# Patient Record
Sex: Female | Born: 1990 | Race: Black or African American | Hispanic: No | Marital: Single | Smoking: Never smoker
Health system: Southern US, Community
[De-identification: ages and names within clinical notes are randomized; demographics above are authoritative.]

## PROBLEM LIST (undated history)

## (undated) DIAGNOSIS — K219 Gastro-esophageal reflux disease without esophagitis: Secondary | ICD-10-CM

## (undated) DIAGNOSIS — J302 Other seasonal allergic rhinitis: Secondary | ICD-10-CM

## (undated) DIAGNOSIS — J45909 Unspecified asthma, uncomplicated: Secondary | ICD-10-CM

## (undated) HISTORY — PX: UPPER GI ENDOSCOPY: SHX6162

## (undated) HISTORY — PX: OTHER SURGICAL HISTORY: SHX169

## (undated) HISTORY — DX: Other seasonal allergic rhinitis: J30.2

---
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
## 2011-04-04 LAB — URINALYSIS, ROUTINE W REFLEX MICROSCOPIC
Glucose, UA: NEGATIVE mg/dL
Ketones, ur: NEGATIVE mg/dL
Nitrite: NEGATIVE
Protein, ur: NEGATIVE mg/dL

## 2011-04-04 LAB — RPR: RPR Ser Ql: NONREACTIVE

## 2011-04-04 LAB — WET PREP, GENITAL
Clue Cells Wet Prep HPF POC: NONE SEEN
WBC, Wet Prep HPF POC: NONE SEEN

## 2011-04-04 LAB — PREGNANCY, URINE: Preg Test, Ur: NEGATIVE

## 2012-06-17 ENCOUNTER — Emergency Department (HOSPITAL_BASED_OUTPATIENT_CLINIC_OR_DEPARTMENT_OTHER)
Admission: EM | Admit: 2012-06-17 | Discharge: 2012-06-17 | Disposition: A | Discharge status: Home / Self Care | Payer: Self-pay | Attending: Emergency Medicine | Admitting: Emergency Medicine

## 2012-06-17 ENCOUNTER — Encounter (HOSPITAL_BASED_OUTPATIENT_CLINIC_OR_DEPARTMENT_OTHER): Payer: Self-pay | Admitting: *Deleted

## 2012-06-17 DIAGNOSIS — F1010 Alcohol abuse, uncomplicated: Secondary | ICD-10-CM | POA: Insufficient documentation

## 2012-06-17 DIAGNOSIS — R109 Unspecified abdominal pain: Secondary | ICD-10-CM | POA: Insufficient documentation

## 2012-06-17 DIAGNOSIS — R110 Nausea: Secondary | ICD-10-CM | POA: Insufficient documentation

## 2012-06-17 LAB — URINALYSIS, ROUTINE W REFLEX MICROSCOPIC
Bilirubin Urine: NEGATIVE
Glucose, UA: NEGATIVE mg/dL
Ketones, ur: NEGATIVE mg/dL
Nitrite: NEGATIVE
pH: 7.5 (ref 5.0–8.0)

## 2012-06-17 NOTE — ED Notes (Signed)
She is cursing, loud and rude behavior at triage. Her friend states she drank 1/2 bottle of [place]gan before coming here and is drunk.

## 2012-06-17 NOTE — ED Notes (Signed)
Called pt from lobby to treatment with no answer.

## 2012-06-17 NOTE — ED Notes (Signed)
Nausea today. Abdominal pain.

## 2012-06-17 NOTE — ED Notes (Signed)
Called pt from lobby with no answer 

## 2012-10-04 ENCOUNTER — Encounter (HOSPITAL_COMMUNITY): Payer: Self-pay | Admitting: Emergency Medicine

## 2012-10-04 ENCOUNTER — Emergency Department (HOSPITAL_COMMUNITY)
Admission: EM | Admit: 2012-10-04 | Discharge: 2012-10-04 | Disposition: A | Discharge status: Home / Self Care | Payer: BC Managed Care – PPO | Attending: Emergency Medicine | Admitting: Emergency Medicine

## 2012-10-04 DIAGNOSIS — X58XXXA Exposure to other specified factors, initial encounter: Secondary | ICD-10-CM | POA: Insufficient documentation

## 2012-10-04 DIAGNOSIS — IMO0002 Reserved for concepts with insufficient information to code with codable children: Secondary | ICD-10-CM | POA: Insufficient documentation

## 2012-10-04 DIAGNOSIS — S30814A Abrasion of vagina and vulva, initial encounter: Secondary | ICD-10-CM

## 2012-10-04 MED ORDER — LIDOCAINE 4 % EX CREA: TOPICAL_CREAM | Freq: Every day | CUTANEOUS | Status: DC | PRN

## 2012-10-04 MED ORDER — LIDOCAINE HCL 2 % EX GEL: Freq: Every day | CUTANEOUS | Status: DC | PRN

## 2012-10-04 MED ORDER — LIDOCAINE HCL 2 % EX GEL
Freq: Every day | CUTANEOUS | Status: DC | PRN
  Filled 2012-10-04: qty 5

## 2012-10-04 NOTE — ED Provider Notes (Signed)
History     CSN: [id number]  Arrival date & time 10/04/12  1155   First MD Initiated Contact with Patient 10/04/12 1228      Chief Complaint  Patient presents with  . Laceration    (Consider location/radiation/quality/duration/timing/severity/associated sxs/prior treatment) HPI Comments: Patient is a 21 year old female presents emergency department chief complaint of post coital labial tear.  Incident occurred approximately 2 days ago and patient reports mild pain with urination and minimal bleeding.  Patient denies any abnormal vaginal discharge or malodor.  Patient has not had intercourse since the incident and is unaware of any dyspareunia.  She denies abdominal pain, lower back pain, urinary frequency or urgency, fevers, night sweats, or chills. Pt does not have an OBGYN. No  other complaints at this time.   The history is provided by the patient.    History reviewed. No pertinent past medical history.  History reviewed. No pertinent past surgical history.  History reviewed. No pertinent family history.  History  Substance Use Topics  . Smoking status: Never Smoker   . Smokeless tobacco: Not on file  . Alcohol Use: Yes    OB History    Grav Para Term Preterm Abortions TAB SAB Ect Mult Living                  Review of Systems  Constitutional: Negative for fever, diaphoresis and activity change.  HENT: Negative for congestion and neck pain.   Respiratory: Negative for cough.   Genitourinary: Positive for vaginal pain. Negative for dysuria, urgency, frequency, hematuria, flank pain, decreased urine volume, vaginal bleeding, vaginal discharge, enuresis, difficulty urinating, menstrual problem and pelvic pain.  Musculoskeletal: Negative for myalgias.  Skin: Negative for color change and wound.  Neurological: Negative for headaches.  All other systems reviewed and are negative.    Allergies  Review of patient's allergies indicates no known allergies.  Home  Medications  No current outpatient prescriptions on file.  BP 120/77  Pulse 63  Temp 98.4 F (36.9 C) (Oral)  Resp 18  LMP 09/25/2012  Physical Exam  Nursing note and vitals reviewed. Constitutional: She is oriented to person, place, and time. She appears well-developed and well-nourished. No distress.  HENT:  Head: Normocephalic and atraumatic.  Eyes: Conjunctivae normal and EOM are normal.  Neck: Normal range of motion.  Pulmonary/Chest: Effort normal.  Genitourinary:          Pelvic exam deferred.   Musculoskeletal: Normal range of motion.  Neurological: She is alert and oriented to person, place, and time.  Skin: Skin is warm and dry. No rash noted. She is not diaphoretic.  Psychiatric: She has a normal mood and affect. Her behavior is normal.    ED Course  Procedures (including critical care time)   Labs Reviewed  HERPES SIMPLEX VIRUS CULTURE   No results found.   No diagnosis found.    MDM  Pt presents with post coital abrasion. On exam is appears more like an abrasion rather then ulceration, however HSV cultures pending as well. Pt denies abnormal odor or dc & has no hx of STD. Pelvic exam deferred, reccommended OBGYN follow up and no intercourse until completely healed.         Jaci Carrel, [place] 10/04/12 1339

## 2012-10-04 NOTE — ED Notes (Addendum)
Reports cut to labia occuring after sexual intimacy. Bleeding noted on toilet tissue and burns. Not requiring panty liner, tampon or pad not experiencing vaginal bleeding. Pain 4/10 however when cut is exposed to urine pain 10/10.

## 2012-10-04 NOTE — ED Provider Notes (Signed)
Medical screening examination/treatment/procedure(s) were performed by non-physician practitioner and as supervising physician I was immediately available for consultation/collaboration.   Celene Kras, MD 10/04/12 1341

## 2012-10-06 LAB — HERPES SIMPLEX VIRUS CULTURE
Culture: NOT DETECTED
Special Requests: NORMAL

## 2013-02-02 ENCOUNTER — Emergency Department (HOSPITAL_COMMUNITY)
Admission: EM | Admit: 2013-02-02 | Discharge: 2013-02-02 | Disposition: A | Discharge status: Home / Self Care | Payer: BC Managed Care – PPO | Attending: Emergency Medicine | Admitting: Emergency Medicine

## 2013-02-02 ENCOUNTER — Encounter (HOSPITAL_COMMUNITY): Payer: Self-pay

## 2013-02-02 DIAGNOSIS — J45909 Unspecified asthma, uncomplicated: Secondary | ICD-10-CM | POA: Insufficient documentation

## 2013-02-02 DIAGNOSIS — R109 Unspecified abdominal pain: Secondary | ICD-10-CM | POA: Insufficient documentation

## 2013-02-02 DIAGNOSIS — Z3202 Encounter for pregnancy test, result negative: Secondary | ICD-10-CM | POA: Insufficient documentation

## 2013-02-02 HISTORY — DX: Unspecified asthma, uncomplicated: J45.909

## 2013-02-02 LAB — URINALYSIS, ROUTINE W REFLEX MICROSCOPIC
Leukocytes, UA: NEGATIVE
Nitrite: NEGATIVE
Protein, ur: NEGATIVE mg/dL
Urobilinogen, UA: 0.2 mg/dL (ref 0.0–1.0)

## 2013-02-02 LAB — POCT PREGNANCY, URINE: Preg Test, Ur: NEGATIVE

## 2013-02-02 NOTE — ED Provider Notes (Signed)
History     CSN: [id number]  Arrival date & time 02/02/13  1004   First MD Initiated Contact with Patient 02/02/13 1033      Chief Complaint  Patient presents with  . Headache  . Abdominal Pain    (Consider location/radiation/quality/duration/timing/severity/associated sxs/prior treatment) HPI... intermittent lower abdominal cramping for several days. Menstrual periods approximately one week late. Patient is sexually active with condoms for birth control. No dysuria, fever, chills, nausea, vomiting, vaginal bleeding or discharge. She [facility].  Nothing makes symptoms better or worse. Severity is mild. No radiation.  Past Medical History  Diagnosis Date  . Asthma     History reviewed. No pertinent past surgical history.  Family History  Problem Relation Age of Onset  . Hypertension Mother   . Hypertension Father     History  Substance Use Topics  . Smoking status: Never Smoker   . Smokeless tobacco: Never Used  . Alcohol Use: No    OB History   Grav Para Term Preterm Abortions TAB SAB Ect Mult Living                  Review of Systems  All other systems reviewed and are negative.    Allergies  Review of patient's allergies indicates no known allergies.  Home Medications  No current outpatient prescriptions on file.  BP 137/92  Pulse 81  Temp(Src) 97.8 F (36.6 C) (Oral)  Resp 22  SpO2 100%  LMP 12/26/2012  Physical Exam  Nursing note and vitals reviewed. Constitutional: She is oriented to person, place, and time. She appears well-developed and well-nourished.  HENT:  Head: Normocephalic and atraumatic.  Eyes: Conjunctivae and EOM are normal. Pupils are equal, round, and reactive to light.  Neck: Normal range of motion. Neck supple.  Cardiovascular: Normal rate, regular rhythm and normal heart sounds.   Pulmonary/Chest: Effort normal and breath sounds normal.  Abdominal: Soft. Bowel sounds are normal.  No  lower abdominal tenderness to palpation  Musculoskeletal: Normal range of motion.  Neurological: She is alert and oriented to person, place, and time.  Skin: Skin is warm and dry.  Psychiatric: She has a normal mood and affect.    ED Course  Procedures (including critical care time)  Labs Reviewed  URINALYSIS, ROUTINE W REFLEX MICROSCOPIC  POCT PREGNANCY, URINE   No results found.   1. Abdominal pain       MDM   Patient looks well. No acute abdomen. No unusual vaginal discharge or bleeding. She is smiling and pleasant.  Pelvic exam not indicated. [facility]. urinalysis and pregnancy test negative       Donnetta Hutching, MD 02/02/13 1239

## 2013-02-02 NOTE — ED Notes (Signed)
Pt. Is not able to use the restroom at this time but she is aware that we need urine.

## 2013-02-02 NOTE — ED Notes (Signed)
Patient c/o intermittent headaches and abdominal cramping. Patient states this is like symptoms of menstrual pain, but is a week later than usual. Patient states she did a home pregnancy test and it was negative. Patient also states she is having a cream-colored vaginal discharge.

## 2014-03-28 ENCOUNTER — Encounter (HOSPITAL_BASED_OUTPATIENT_CLINIC_OR_DEPARTMENT_OTHER): Payer: Self-pay | Admitting: Emergency Medicine

## 2014-03-28 ENCOUNTER — Emergency Department (HOSPITAL_BASED_OUTPATIENT_CLINIC_OR_DEPARTMENT_OTHER)
Admission: EM | Admit: 2014-03-28 | Discharge: 2014-03-28 | Disposition: A | Discharge status: Home / Self Care | Payer: BC Managed Care – PPO | Attending: Emergency Medicine | Admitting: Emergency Medicine

## 2014-03-28 DIAGNOSIS — J45909 Unspecified asthma, uncomplicated: Secondary | ICD-10-CM | POA: Insufficient documentation

## 2014-03-28 DIAGNOSIS — B3731 Acute candidiasis of vulva and vagina: Secondary | ICD-10-CM

## 2014-03-28 DIAGNOSIS — B373 Candidiasis of vulva and vagina: Secondary | ICD-10-CM

## 2014-03-28 DIAGNOSIS — N898 Other specified noninflammatory disorders of vagina: Secondary | ICD-10-CM | POA: Insufficient documentation

## 2014-03-28 DIAGNOSIS — Z3202 Encounter for pregnancy test, result negative: Secondary | ICD-10-CM | POA: Insufficient documentation

## 2014-03-28 LAB — URINALYSIS, ROUTINE W REFLEX MICROSCOPIC
Bilirubin Urine: NEGATIVE
GLUCOSE, UA: NEGATIVE mg/dL
HGB URINE DIPSTICK: NEGATIVE
Ketones, ur: NEGATIVE mg/dL
Nitrite: NEGATIVE
PH: 7.5 (ref 5.0–8.0)
PROTEIN: NEGATIVE mg/dL
SPECIFIC GRAVITY, URINE: 1.019 (ref 1.005–1.030)
Urobilinogen, UA: 1 mg/dL (ref 0.0–1.0)

## 2014-03-28 LAB — URINE MICROSCOPIC-ADD ON

## 2014-03-28 LAB — WET PREP, GENITAL: TRICH WET PREP: NONE SEEN

## 2014-03-28 LAB — PREGNANCY, URINE: PREG TEST UR: NEGATIVE

## 2014-03-28 MED ORDER — FLUCONAZOLE 100 MG PO TABS
200.0000 mg | ORAL_TABLET | Freq: Once | ORAL | Status: AC
  Administered 2014-03-28: 200 mg via ORAL
  Filled 2014-03-28: qty 2

## 2014-03-28 NOTE — Discharge Instructions (Signed)
Candidal Vulvovaginitis  Candidal vulvovaginitis is an infection of the vagina and vulva. The vulva is the skin around the opening of the vagina. This may cause itching and discomfort in and around the vagina.   HOME CARE  · Only take medicine as told by your doctor.  · Do not have sex (intercourse) until the infection is healed or as told by your doctor.  · Practice safe sex.  · Tell your sex partner about your infection.  · Do not douche or use tampons.  · Wear cotton underwear. Do not wear tight pants or panty hose.  · Eat yogurt. This may help treat and prevent yeast infections.  GET HELP RIGHT AWAY IF:   · You have a fever.  · Your problems get worse during treatment or do not get better in 3 days.  · You have discomfort, irritation, or itching in your vagina or vulva area.  · You have pain after sex.  · You start to get belly (abdominal) pain.  MAKE SURE YOU:  · Understand these instructions.  · Will watch your condition.  · Will get help right away if you are not doing well or get worse.  Document Released: 03/08/2009 Document Revised: 03/03/2012 Document Reviewed: 03/08/2009  Exit[employer]® Patient Information ©2014 [employer].

## 2014-03-28 NOTE — ED Provider Notes (Signed)
CSN: [id number]     Arrival date & time 03/28/14  1316 History   First MD Initiated Contact with Patient 03/28/14 1332     Chief Complaint  Patient presents with  . Vaginal Itching     (Consider location/radiation/quality/duration/timing/severity/associated sxs/prior Treatment) Patient is a 23 y.o. female presenting with vaginal itching. The history is provided by the patient.  Vaginal Itching This is a new problem. Episode onset: 4 days. The problem occurs constantly. The problem has been gradually worsening. Associated symptoms comments: Vaginal itching and thick white discharge.  No urinary sx and last menses finished 4 days ago. Nothing aggravates the symptoms. Nothing relieves the symptoms. She has tried nothing for the symptoms. The treatment provided no relief.    Past Medical History  Diagnosis Date  . Asthma    History reviewed. No pertinent past surgical history. Family History  Problem Relation Age of Onset  . Hypertension Mother   . Hypertension Father    History  Substance Use Topics  . Smoking status: Never Smoker   . Smokeless tobacco: Never Used  . Alcohol Use: No   OB History   Grav Para Term Preterm Abortions TAB SAB Ect Mult Living                 Review of Systems  All other systems reviewed and are negative.      Allergies  Review of patient's allergies indicates no known allergies.  Home Medications  No current outpatient prescriptions on file. BP 129/86  Pulse 65  Temp(Src) 98 F (36.7 C)  Resp 20  Ht 5\' 7"  (1.702 m)  Wt 150 lb (68.04 kg)  BMI 23.49 kg/m2  SpO2 100%  LMP 03/22/2014 Physical Exam  Nursing note and vitals reviewed. Constitutional: She is oriented to person, place, and time. She appears well-developed and well-nourished. No distress.  HENT:  Head: Normocephalic and atraumatic.  Cardiovascular: Normal rate.   Pulmonary/Chest: Effort normal.  Abdominal: Soft. She exhibits no distension. There is no tenderness. There is  no rebound and no guarding.  Genitourinary: Uterus normal. There is no tenderness on the right labia. There is no tenderness on the left labia. Cervix exhibits discharge. Cervix exhibits no motion tenderness and no friability. No erythema, tenderness or bleeding around the vagina. Vaginal discharge found.  Curd like white discharge  Neurological: She is alert and oriented to person, place, and time.  Skin: Skin is warm and dry. No rash noted. No erythema.  Psychiatric: She has a normal mood and affect. Her behavior is normal.    ED Course  Procedures (including critical care time) Labs Review Labs Reviewed  WET PREP, GENITAL - Abnormal; Notable for the following:    Yeast Wet Prep HPF POC MODERATE (*)    Clue Cells Wet Prep HPF POC TOO NUMEROUS TO COUNT (*)    WBC, Wet Prep HPF POC TOO NUMEROUS TO COUNT (*)    All other components within normal limits  GC/CHLAMYDIA PROBE AMP  PREGNANCY, URINE  URINALYSIS, ROUTINE W REFLEX MICROSCOPIC   Imaging Review No results found.   EKG Interpretation None      MDM   Final diagnoses:  Vaginal candida    Patient here with symptoms most consistent with a yeast infection and vaginal exam consistent with yeast with curd-like discharge but no pain or tenderness. Low risk for STD.  Wet prep with white blood cells too numerous to count and moderate yeast. Will treat with Diflucan and over-the-counter vaginal treatment.  UPT neg.    Gwyneth SproutWhitney Jupiter Boys, MD 03/28/14 [phone]

## 2014-03-28 NOTE — ED Notes (Signed)
Pelvic cart set up at pt bedside. 

## 2014-03-28 NOTE — ED Notes (Signed)
Reports onset of vaginal itching, dryness two days ago.  Has not used any products to help with this at home. Denies changes in tampons, lotions, or soaps.  Reports shaving recently.  In a monogamous female relationship, uses protection.

## 2014-03-29 LAB — GC/CHLAMYDIA PROBE AMP
CT Probe RNA: NEGATIVE
GC Probe RNA: NEGATIVE

## 2014-05-08 ENCOUNTER — Encounter (HOSPITAL_BASED_OUTPATIENT_CLINIC_OR_DEPARTMENT_OTHER): Payer: Self-pay | Admitting: Emergency Medicine

## 2014-05-08 ENCOUNTER — Emergency Department (HOSPITAL_BASED_OUTPATIENT_CLINIC_OR_DEPARTMENT_OTHER)
Admission: EM | Admit: 2014-05-08 | Discharge: 2014-05-09 | Disposition: A | Discharge status: Home / Self Care | Payer: BC Managed Care – PPO | Attending: Emergency Medicine | Admitting: Emergency Medicine

## 2014-05-08 DIAGNOSIS — J029 Acute pharyngitis, unspecified: Secondary | ICD-10-CM

## 2014-05-08 DIAGNOSIS — J45909 Unspecified asthma, uncomplicated: Secondary | ICD-10-CM | POA: Insufficient documentation

## 2014-05-08 LAB — RAPID STREP SCREEN (MED CTR MEBANE ONLY): STREPTOCOCCUS, GROUP A SCREEN (DIRECT): NEGATIVE

## 2014-05-08 NOTE — ED Provider Notes (Signed)
CSN: [id number]     Arrival date & time 05/08/14  2312 History  This chart was scribed for Joya Gaskinsonald W Cilicia Borden, MD by Beverly MilchJ Harrison Collins, ED Scribe. This patient was seen in room MH06/MH06 and the patient's care was started at 12:07 PM.    Chief Complaint  Patient presents with  . Sore Throat    Patient is a [age] y.o. female presenting with pharyngitis. The history is provided by the patient. No language interpreter was used.  Sore Throat This is a new problem. The current episode started more than 2 days ago. The problem occurs constantly. The problem has been rapidly worsening. Associated symptoms include headaches. Pertinent negatives include no chest pain, no abdominal pain and no shortness of breath. The symptoms are aggravated by swallowing and eating. Nothing relieves the symptoms. She has tried nothing for the symptoms.    HPI Comments: Anita Bush is a 23 y.o. female who presents to the Emergency Department complaining of soreness in her throat. She reports associated white particles in her throat and a mild head ache. She states these symptoms began 3 days ago. Pt unsure of a possible relation to allergies. She denies fever, abdominal pain, and CP.   Past Medical History  Diagnosis Date  . Asthma     History reviewed. No pertinent past surgical history. Family History  Problem Relation Age of Onset  . Hypertension Mother   . Hypertension Father     History  Substance Use Topics  . Smoking status: Never Smoker   . Smokeless tobacco: Never Used  . Alcohol Use: No    OB History   Grav Para Term Preterm Abortions TAB SAB Ect Mult Living                  Review of Systems  HENT: Positive for sore throat.   Respiratory: Negative for shortness of breath.   Cardiovascular: Negative for chest pain.  Gastrointestinal: Negative for abdominal pain.  Neurological: Positive for headaches.  All other systems reviewed and are negative.   Allergies  Review of patient's  allergies indicates no known allergies.  Home Medications   Prior to Admission medications   Not on File     BP 111/67  Pulse 60  SpO2 98%  LMP 04/23/2014  Physical Exam  Nursing note and vitals reviewed. CONSTITUTIONAL: Well developed/well nourished HEAD: Normocephalic/atraumatic EYES: EOMI/PERRL ENMT: Mucous membranes moist, uvula midline, tonsils enlarged, erythema and exudation present NECK: supple no meningeal signs SPINE:entire spine nontender CV: S1/S2 noted, no murmurs/rubs/gallops noted LUNGS: Lungs are clear to auscultation bilaterally, no apparent distress NEURO: Pt is awake/alert, moves all extremitiesx4 EXTREMITIES: pulses normal, full ROM SKIN: warm, color normal PSYCH: no abnormalities of mood noted   ED Course  Procedures    DIAGNOSTIC STUDIES: Oxygen Saturation is 100% on RA, normal by my interpretation.     COORDINATION OF CARE: 12:11 AM- Pt advised of plan for treatment and pt agrees.   Labs Review Labs Reviewed  RAPID STREP SCREEN  CULTURE, GROUP A STREP     MDM   Final diagnoses:  None    Nursing notes including past medical history and social history reviewed and considered in documentation Labs/vital reviewed and considered   I personally performed the services described in this documentation, which was scribed in my presence. The recorded information has been reviewed and is accurate.      Joya Gaskinsonald W Paxson Harrower, MD 05/09/14 [phone]

## 2014-05-08 NOTE — ED Notes (Signed)
Pt reports throat pain purulent drainage noted

## 2014-05-09 MED ORDER — PENICILLIN V POTASSIUM 500 MG PO TABS: 500.0000 mg | ORAL_TABLET | Freq: Four times a day (QID) | ORAL | Status: DC

## 2014-05-10 LAB — CULTURE, GROUP A STREP

## 2014-05-12 ENCOUNTER — Ambulatory Visit (INDEPENDENT_AMBULATORY_CARE_PROVIDER_SITE_OTHER): Payer: BC Managed Care – PPO | Admitting: Physician Assistant

## 2014-05-12 ENCOUNTER — Encounter: Payer: Self-pay | Admitting: Physician Assistant

## 2014-05-12 VITALS — BP 98/64 | HR 69 | Temp 98.5°F | Resp 16 | Ht 67.0 in | Wt 168.0 lb

## 2014-05-12 DIAGNOSIS — Z7189 Other specified counseling: Secondary | ICD-10-CM

## 2014-05-12 DIAGNOSIS — J358 Other chronic diseases of tonsils and adenoids: Secondary | ICD-10-CM | POA: Insufficient documentation

## 2014-05-12 DIAGNOSIS — Z7689 Persons encountering health services in other specified circumstances: Secondary | ICD-10-CM

## 2014-05-12 NOTE — Patient Instructions (Addendum)
STOP Penicillin.  The "white spots" on your tonsils seem consistent with Tonsilliths (tonsil stones) that are an accumulation of food particles that are getting stuck in the pits on your tonsils. Please read handout on this.  I am setting you up with ENT due to your concerns about your symptoms.

## 2014-05-12 NOTE — Assessment & Plan Note (Signed)
Multiple.  Patient also with cryptic tonsils.  Workup for strep pharyngitis revealed negative rapid strep and culture.  There is no need for patient to continue antibiotic.  Patient educated on ways to remove and prevent tonsil stones.  Referral to ENT placed giving number of stones and patient's concerns about stones.

## 2014-05-12 NOTE — Progress Notes (Signed)
Patient presents to clinic today to establish care.  Acute Concerns: Patient seen in ER on 05/08/14 and diagnosed with pharyngitis.  Rapid strep and Strep culture were negative but patient was given Rx for penicillin due to "spots" on tonsils.  Patient has been taking Penicillin as directed, but has been having considerable nausea with antibiotic.  Patient denies sore throat, dysphagia, SOB, cough.  Endorses still notices white spots on her tonsils.  Denies recent travel or sick contact. Has history of seasonal allergies. Is wondering if she still needs antibiotic.  Chronic Issues: Denies significant PMH  Health Maintenance: Dental -- UTD [employer] -- Overdue. Immunizations -- Has been within 10 years -- likely 5-6 years ago. PAP -- Cannot remember.    Past Medical History  Diagnosis Date  . Asthma   . Seasonal allergies     Past Surgical History  Procedure Laterality Date  . Unremarkable.      No current outpatient prescriptions on file prior to visit.   No current facility-administered medications on file prior to visit.    No Known Allergies  Family History  Problem Relation Age of Onset  . Hypertension Mother     Living  . Hypertension Father     Living  . Arthritis Mother   . Hyperlipidemia Mother   . Asthma Mother   . Hypertension Maternal Grandmother   . Arthritis Maternal Grandmother   . Hyperlipidemia Maternal Grandmother   . Diabetes Maternal Grandmother     Pre-diabetes  . Allergy (severe) Brother   . Allergy (severe) Sister     History   Social History  . Marital Status: Single    Spouse Name: N/A    Number of Children: N/A  . Years of Education: N/A   Occupational History  . Not on file.   Social History Main Topics  . Smoking status: Never Smoker   . Smokeless tobacco: Never Used  . Alcohol Use: Yes     Comment: social -- weekends  . Drug Use: No  . Sexual Activity: Yes    Birth Control/ Protection: Condom   Other Topics Concern  . Not  on file   Social History Narrative  . No narrative on file   Review of Systems  Constitutional: Negative for fever and weight loss.  HENT: Negative for ear discharge, ear pain, hearing loss and tinnitus.   Eyes: Negative for blurred vision, double vision, photophobia and pain.  Respiratory: Negative for cough and shortness of breath.   Cardiovascular: Negative for chest pain and palpitations.  Gastrointestinal: Negative for heartburn, nausea, vomiting, abdominal pain, diarrhea, constipation, blood in stool and melena.  Genitourinary: Negative for dysuria, urgency, frequency, hematuria and flank pain.  Skin: Negative for rash.  Neurological: Negative for dizziness, loss of consciousness and headaches.  Endo/Heme/Allergies: Positive for environmental allergies.  Psychiatric/Behavioral: Negative for depression, suicidal ideas, hallucinations and substance abuse. The patient is not nervous/anxious and does not have insomnia.     BP 98/64  Pulse 69  Temp(Src) 98.5 F (36.9 C) (Oral)  Resp 16  Ht 5\' 7"  (1.702 m)  Wt 168 lb (76.204 kg)  BMI 26.31 kg/m2  SpO2 98%  LMP 04/23/2014  Physical Exam  Vitals reviewed. Constitutional: She is oriented to person, place, and time and well-developed, well-nourished, and in no distress.  HENT:  Head: Normocephalic and atraumatic.  Right Ear: Tympanic membrane, external ear and ear canal normal.  Left Ear: Tympanic membrane, external ear and ear canal normal.  Nose: Nose normal.  Mouth/Throat: Mucous membranes are normal. No oropharyngeal exudate, posterior oropharyngeal edema, posterior oropharyngeal erythema or tonsillar abscesses.  Tonsils are 1+ bilaterally and cryptic with presence of multiple tonsilliths residing in crypts.  Tonsils are not edematous or erythematous.  There is no appreciable lymphadenopathy.  Eyes: Conjunctivae are normal. Pupils are equal, round, and reactive to light.  Neck: Neck supple.  Cardiovascular: Normal rate,  regular rhythm, normal heart sounds and intact distal pulses.   Pulmonary/Chest: Effort normal and breath sounds normal. No respiratory distress. She has no wheezes. She has no rales. She exhibits no tenderness.  Neurological: She is alert and oriented to person, place, and time.  Skin: Skin is warm and dry. No rash noted.  Psychiatric: Affect normal.    Recent Results (from the past 2160 hour(s))  URINALYSIS, ROUTINE W REFLEX MICROSCOPIC     Status: Abnormal   Collection Time    03/28/14  1:45 PM      Result Value Ref Range   Color, Urine YELLOW  YELLOW   APPearance CLOUDY (*) CLEAR   Specific Gravity, Urine 1.019  1.005 - 1.030   pH 7.5  5.0 - 8.0   Glucose, UA NEGATIVE  NEGATIVE mg/dL   Hgb urine dipstick NEGATIVE  NEGATIVE   Bilirubin Urine NEGATIVE  NEGATIVE   Ketones, ur NEGATIVE  NEGATIVE mg/dL   Protein, ur NEGATIVE  NEGATIVE mg/dL   Urobilinogen, UA 1.0  0.0 - 1.0 mg/dL   Nitrite NEGATIVE  NEGATIVE   Leukocytes, UA MODERATE (*) NEGATIVE  PREGNANCY, URINE     Status: None   Collection Time    03/28/14  1:45 PM      Result Value Ref Range   Preg Test, Ur NEGATIVE  NEGATIVE   Comment:            THE SENSITIVITY OF THIS     METHODOLOGY IS >20 mIU/mL.  URINE MICROSCOPIC-ADD ON     Status: Abnormal   Collection Time    03/28/14  1:45 PM      Result Value Ref Range   Squamous Epithelial / LPF MANY (*) RARE   WBC, UA 7-10  <3 WBC/hpf   Bacteria, UA MANY (*) RARE   Urine-Other MUCOUS PRESENT     Comment: RARE YEAST  WET PREP, GENITAL     Status: Abnormal   Collection Time    03/28/14  1:46 PM      Result Value Ref Range   Yeast Wet Prep HPF POC MODERATE (*) NONE SEEN   Trich, Wet Prep NONE SEEN  NONE SEEN   Clue Cells Wet Prep HPF POC TOO NUMEROUS TO COUNT (*) NONE SEEN   WBC, Wet Prep HPF POC TOO NUMEROUS TO COUNT (*) NONE SEEN  GC/CHLAMYDIA PROBE AMP     Status: None   Collection Time    03/28/14  1:46 PM      Result Value Ref Range   CT Probe RNA NEGATIVE   NEGATIVE   GC Probe RNA NEGATIVE  NEGATIVE   Comment: (NOTE)                                                                                               **  Normal Reference Range: Negative**          Assay performed using the Gen-Probe APTIMA COMBO2 (R) Assay.     Acceptable specimen types for this assay include APTIMA Swabs (Unisex,     endocervical, urethral, or vaginal), first void urine, and ThinPrep     liquid based cytology samples.     Performed at [facility]  RAPID STREP SCREEN     Status: None   Collection Time    05/08/14 11:47 PM      Result Value Ref Range   Streptococcus, Group A Screen (Direct) NEGATIVE  NEGATIVE   Comment: (NOTE)     A Rapid Antigen test may result negative if the antigen level in the     sample is below the detection level of this test. The FDA has not     cleared this test as a stand-alone test therefore the rapid antigen     negative result has reflexed to a Group A Strep culture.  CULTURE, GROUP A STREP     Status: None   Collection Time    05/08/14 11:47 PM      Result Value Ref Range   Specimen Description THROAT     Special Requests NONE     Culture       Value: STREPTOCOCCUS,BETA HEMOLYTIC NOT GROUP A     Performed at [facility]   Report Status 05/10/2014 FINAL      Assessment/Plan: Tonsillith Multiple.  Patient also with cryptic tonsils.  Workup for strep pharyngitis revealed negative rapid strep and culture.  There is no need for patient to continue antibiotic.  Patient educated on ways to remove and prevent tonsil stones.  Referral to ENT placed giving number of stones and patient's concerns about stones.  Encounter to establish care History reviewed and updated.  Last Tetanus 5-6 years ago.  Patient due for PAP smear.  Patient is to return to clinic for CPE with PAP and fasting labs.

## 2014-05-12 NOTE — Assessment & Plan Note (Signed)
History reviewed and updated.  Last Tetanus 5-6 years ago.  Patient due for PAP smear.  Patient is to return to clinic for CPE with PAP and fasting labs.

## 2014-05-12 NOTE — Progress Notes (Signed)
Pre visit review using our clinic review tool, if applicable. No additional management support is needed unless otherwise documented below in the visit note/SLS  

## 2014-12-19 ENCOUNTER — Emergency Department (HOSPITAL_COMMUNITY)
Admission: EM | Admit: 2014-12-19 | Discharge: 2014-12-19 | Disposition: A | Discharge status: Home / Self Care | Payer: BC Managed Care – PPO | Attending: Emergency Medicine | Admitting: Emergency Medicine

## 2014-12-19 ENCOUNTER — Emergency Department (HOSPITAL_COMMUNITY): Payer: BC Managed Care – PPO

## 2014-12-19 ENCOUNTER — Encounter (HOSPITAL_COMMUNITY): Payer: Self-pay | Admitting: Emergency Medicine

## 2014-12-19 DIAGNOSIS — Y92410 Unspecified street and highway as the place of occurrence of the external cause: Secondary | ICD-10-CM | POA: Insufficient documentation

## 2014-12-19 DIAGNOSIS — Y998 Other external cause status: Secondary | ICD-10-CM | POA: Insufficient documentation

## 2014-12-19 DIAGNOSIS — J45909 Unspecified asthma, uncomplicated: Secondary | ICD-10-CM | POA: Diagnosis not present

## 2014-12-19 DIAGNOSIS — S0990XA Unspecified injury of head, initial encounter: Secondary | ICD-10-CM | POA: Diagnosis present

## 2014-12-19 DIAGNOSIS — Y9389 Activity, other specified: Secondary | ICD-10-CM | POA: Diagnosis not present

## 2014-12-19 MED ORDER — ACETAMINOPHEN 325 MG PO TABS
650.0000 mg | ORAL_TABLET | Freq: Once | ORAL | Status: AC
  Administered 2014-12-19: 650 mg via ORAL
  Filled 2014-12-19: qty 2
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
## 2014-12-28 ENCOUNTER — Emergency Department (HOSPITAL_BASED_OUTPATIENT_CLINIC_OR_DEPARTMENT_OTHER)
Admission: EM | Admit: 2014-12-28 | Discharge: 2014-12-28 | Disposition: A | Discharge status: Home / Self Care | Payer: BLUE CROSS/BLUE SHIELD | Attending: Emergency Medicine | Admitting: Emergency Medicine

## 2014-12-28 ENCOUNTER — Encounter (HOSPITAL_BASED_OUTPATIENT_CLINIC_OR_DEPARTMENT_OTHER): Payer: Self-pay | Admitting: Emergency Medicine

## 2014-12-28 DIAGNOSIS — N921 Excessive and frequent menstruation with irregular cycle: Secondary | ICD-10-CM | POA: Diagnosis not present

## 2014-12-28 DIAGNOSIS — R42 Dizziness and giddiness: Secondary | ICD-10-CM | POA: Insufficient documentation

## 2014-12-28 DIAGNOSIS — Z3202 Encounter for pregnancy test, result negative: Secondary | ICD-10-CM | POA: Insufficient documentation

## 2014-12-28 DIAGNOSIS — J45909 Unspecified asthma, uncomplicated: Secondary | ICD-10-CM | POA: Insufficient documentation

## 2014-12-28 DIAGNOSIS — N926 Irregular menstruation, unspecified: Secondary | ICD-10-CM

## 2014-12-28 DIAGNOSIS — N939 Abnormal uterine and vaginal bleeding, unspecified: Secondary | ICD-10-CM | POA: Diagnosis present

## 2014-12-28 LAB — BASIC METABOLIC PANEL
Anion gap: 8 (ref 5–15)
BUN: 13 mg/dL (ref 6–23)
CALCIUM: 9.5 mg/dL (ref 8.4–10.5)
CHLORIDE: 106 meq/L (ref 96–112)
CO2: 27 mmol/L (ref 19–32)
Creatinine, Ser: 0.96 mg/dL (ref 0.50–1.10)
GFR calc Af Amer: >90 mL/min (ref >90)
GFR calc non Af Amer: 83 mL/min — ABNORMAL LOW (ref >90)
GLUCOSE: 90 mg/dL (ref 70–99)
POTASSIUM: 3.6 mmol/L (ref 3.5–5.1)
SODIUM: 141 mmol/L (ref 135–145)

## 2014-12-28 LAB — CBC
HCT: 38.5 % (ref 36.0–46.0)
HEMOGLOBIN: 12.6 g/dL (ref 12.0–15.0)
MCH: 31 pg (ref 26.0–34.0)
MCHC: 32.7 g/dL (ref 30.0–36.0)
MCV: 94.6 fL (ref 78.0–100.0)
PLATELETS: 308 10*3/uL (ref 150–400)
RBC: 4.07 MIL/uL (ref 3.87–5.11)
RDW: 12.2 % (ref 11.5–15.5)
WBC: 6.2 10*3/uL (ref 4.0–10.5)

## 2014-12-28 LAB — WET PREP, GENITAL
Trich, Wet Prep: NONE SEEN
Yeast Wet Prep HPF POC: NONE SEEN

## 2014-12-28 LAB — PREGNANCY, URINE: Preg Test, Ur: NEGATIVE

## 2014-12-28 NOTE — ED Provider Notes (Signed)
CSN: [id number]     Arrival date & time 12/28/14  2013 History   First MD Initiated Contact with Patient 12/28/14 2035     Chief Complaint  Patient presents with  . Vaginal Bleeding     (Consider location/radiation/quality/duration/timing/severity/associated sxs/prior Treatment) HPI Comments: The patient is a 24 year old female presenting to the emergency department the chief complaint of abnormal vaginal bleeding. Patient reports onset of spotting 10 days ago. She reports normal menses but several days of spotting from 2 days ago until today. She reports brown vaginal spotting with increase in bleeding today. Patient reports associated cramping. Denies previous history of birth control use, IUD, implant. Reports using one tampon today for bleeding. Reports one episode of lightheadedness this week, no syncope.  The history is provided by the patient. No language interpreter was used.    Past Medical History  Diagnosis Date  . Asthma   . Seasonal allergies    Past Surgical History  Procedure Laterality Date  . Unremarkable.     Family History  Problem Relation Age of Onset  . Hypertension Mother     Living  . Hypertension Father     Living  . Arthritis Mother   . Hyperlipidemia Mother   . Asthma Mother   . Hypertension Maternal Grandmother   . Arthritis Maternal Grandmother   . Hyperlipidemia Maternal Grandmother   . Diabetes Maternal Grandmother     Pre-diabetes  . Allergy (severe) Brother   . Allergy (severe) Sister    History  Substance Use Topics  . Smoking status: Never Smoker   . Smokeless tobacco: Never Used  . Alcohol Use: Yes     Comment: social -- weekends   OB History    No data available     Review of Systems  Constitutional: Negative for fever and chills.  Gastrointestinal: Negative for nausea, vomiting and diarrhea.  Genitourinary: Positive for vaginal bleeding and pelvic pain. Negative for dysuria and vaginal discharge.  Neurological: Positive for  light-headedness. Negative for syncope.      Allergies  Review of patient's allergies indicates no known allergies.  Home Medications   Prior to Admission medications   Not on File   LMP 11/23/2014 (Exact Date) Physical Exam  Constitutional: She is oriented to person, place, and time. She appears well-developed and well-nourished. No distress.  HENT:  Head: Normocephalic and atraumatic.  Neck: Neck supple.  Pulmonary/Chest: Effort normal. No respiratory distress.  Abdominal: Soft. There is no tenderness. There is no rebound and no guarding.  Genitourinary: Cervix exhibits motion tenderness. Cervix exhibits no friability. Right adnexum displays no tenderness. Left adnexum displays no tenderness.  Minimal amount of dark blood in posterior vaginal vault. Mild CMT tenderness. Chaperone present.  Neurological: She is alert and oriented to person, place, and time.  Skin: Skin is warm and dry. She is not diaphoretic. No pallor.  Psychiatric: She has a normal mood and affect. Her behavior is normal.  Nursing note and vitals reviewed.   ED Course  Procedures (including critical care time) Labs Review Labs Reviewed  WET PREP, GENITAL - Abnormal; Notable for the following:    Clue Cells Wet Prep HPF POC FEW (*)    WBC, Wet Prep HPF POC FEW (*)    All other components within normal limits  BASIC METABOLIC PANEL - Abnormal; Notable for the following:    GFR calc non Af Amer 83 (*)    All other components within normal limits  GC/CHLAMYDIA PROBE AMP  CBC  PREGNANCY, URINE    Imaging Review No results found.   EKG Interpretation None      MDM   Final diagnoses:  Abnormal menses   Patient with abnormal menses, in no acute distress. Pelvic with minimal amount of blood. Labs reassuring and no signs of anemia, no UTI negative pregnancy. Patient has clue cells but is not complaining of vaginal discharge, will not treat at this time. Reevaluation discussed lab results and  treatment plan with the patient's mother and patient. Advised follow-up with women's clinic.Reports understanding and no other concerns at this time.  Patient is stable for discharge at this time.    Mellody DrownLauren Prosperity Darrough, PA-C 12/28/14 2202  Mirian MoMatthew Gentry, MD 12/31/14 [phone]

## 2014-12-28 NOTE — ED Notes (Signed)
Patient states that she goes through about 1 -2 pads a day since the 27th. She was spotting on the 27th. The patient reports since then it was a normal period. Cramping and lower back pain. Patient states that at times she feels dizziness.

## 2014-12-28 NOTE — Discharge Instructions (Signed)
Call for a follow up appointment with an OB/GYN for further evaluation of your abnormal period and possible treatment. Return if Symptoms worsen.   Take medication as prescribed.

## 2014-12-30 LAB — GC/CHLAMYDIA PROBE AMP
CT Probe RNA: NEGATIVE
GC PROBE AMP APTIMA: NEGATIVE
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
## 2015-09-01 DIAGNOSIS — M20022 Boutonniere deformity of left finger(s): Secondary | ICD-10-CM | POA: Insufficient documentation

## 2015-12-01 DIAGNOSIS — L810 Postinflammatory hyperpigmentation: Secondary | ICD-10-CM | POA: Insufficient documentation

## 2015-12-19 ENCOUNTER — Encounter (HOSPITAL_COMMUNITY): Payer: Self-pay

## 2015-12-19 ENCOUNTER — Other Ambulatory Visit (HOSPITAL_COMMUNITY)
Admission: RE | Admit: 2015-12-19 | Discharge: 2015-12-19 | Disposition: A | Discharge status: Home / Self Care | Payer: BLUE CROSS/BLUE SHIELD | Source: Ambulatory Visit | Attending: Emergency Medicine | Admitting: Emergency Medicine

## 2015-12-19 ENCOUNTER — Emergency Department (INDEPENDENT_AMBULATORY_CARE_PROVIDER_SITE_OTHER)
Admission: EM | Admit: 2015-12-19 | Discharge: 2015-12-19 | Disposition: A | Discharge status: Home / Self Care | Payer: BLUE CROSS/BLUE SHIELD | Source: Home / Self Care | Attending: Emergency Medicine | Admitting: Emergency Medicine

## 2015-12-19 DIAGNOSIS — Z113 Encounter for screening for infections with a predominantly sexual mode of transmission: Secondary | ICD-10-CM | POA: Diagnosis not present

## 2015-12-19 DIAGNOSIS — N898 Other specified noninflammatory disorders of vagina: Secondary | ICD-10-CM

## 2015-12-19 DIAGNOSIS — N760 Acute vaginitis: Secondary | ICD-10-CM | POA: Insufficient documentation

## 2015-12-19 DIAGNOSIS — A499 Bacterial infection, unspecified: Secondary | ICD-10-CM | POA: Diagnosis not present

## 2015-12-19 DIAGNOSIS — B9689 Other specified bacterial agents as the cause of diseases classified elsewhere: Secondary | ICD-10-CM

## 2015-12-19 LAB — POCT URINALYSIS DIP (DEVICE)
Bilirubin Urine: NEGATIVE
GLUCOSE, UA: NEGATIVE mg/dL
Hgb urine dipstick: NEGATIVE
Ketones, ur: NEGATIVE mg/dL
Leukocytes, UA: NEGATIVE
Nitrite: NEGATIVE
Protein, ur: NEGATIVE mg/dL
Specific Gravity, Urine: 1.025 (ref 1.005–1.030)
Urobilinogen, UA: 0.2 mg/dL (ref 0.0–1.0)
pH: 6.5 (ref 5.0–8.0)

## 2015-12-19 LAB — POCT PREGNANCY, URINE: PREG TEST UR: NEGATIVE

## 2015-12-19 MED ORDER — METRONIDAZOLE 0.75 % VA GEL: 1.0000 | Freq: Two times a day (BID) | VAGINAL | Status: DC

## 2015-12-19 NOTE — Discharge Instructions (Signed)

## 2015-12-19 NOTE — ED Notes (Signed)
C/o frequent BV

## 2015-12-19 NOTE — ED Provider Notes (Signed)
CSN: [id number]     Arrival date & time 12/19/15  1305 History   First MD Initiated Contact with Patient 12/19/15 1432     No chief complaint on file.  (Consider location/radiation/quality/duration/timing/severity/associated sxs/prior Treatment) HPI History obtained from patient:   LOCATION:vagina SEVERITY: DURATION:several days CONTEXT: sudden onset QUALITY:similar to previous BV infections MODIFYING FACTORS: None ASSOCIATED SYMPTOMS:cramps TIMING:constant    Past Medical History  Diagnosis Date  . Asthma   . Seasonal allergies    Past Surgical History  Procedure Laterality Date  . Unremarkable.     Family History  Problem Relation Age of Onset  . Hypertension Mother     Living  . Hypertension Father     Living  . Arthritis Mother   . Hyperlipidemia Mother   . Asthma Mother   . Hypertension Maternal Grandmother   . Arthritis Maternal Grandmother   . Hyperlipidemia Maternal Grandmother   . Diabetes Maternal Grandmother     Pre-diabetes  . Allergy (severe) Brother   . Allergy (severe) Sister    Social History  Substance Use Topics  . Smoking status: Never Smoker   . Smokeless tobacco: Never Used  . Alcohol Use: Yes     Comment: social -- weekends   OB History    No data available     Review of Systems ROS +'ve vaginal discharge  Denies: HEADACHE, NAUSEA, ABDOMINAL PAIN, CHEST PAIN, CONGESTION, DYSURIA, SHORTNESS OF BREATH  Allergies  Review of patient's allergies indicates no known allergies.  Home Medications   Prior to Admission medications   Not on File   Meds Ordered and Administered this Visit  Medications - No data to display  BP 123/77 mmHg  Pulse 60  Temp(Src) 98.5 F (36.9 C) (Oral)  Resp 16  SpO2 100% No data found.   Physical Exam  Constitutional: She is oriented to person, place, and time. She appears well-developed and well-nourished.  Pulmonary/Chest: Effort normal.  Genitourinary: Vaginal discharge found.  Female  chaperone was present during the examination. Examination is completed with patient's permission.  Speculum is placed in the vaginal vault large amount of take white vaginal discharge noted. Cervix is closed not friable and nontender.  No adnexal tenderness noted to palpation.  Neurological: She is alert and oriented to person, place, and time.  Skin: Skin is warm and dry.  Psychiatric: She has a normal mood and affect. Her behavior is normal. Judgment and thought content normal.    ED Course  Procedures (including critical care time)  Labs Review Labs Reviewed - No data to display  Imaging Review No results found.   Visual Acuity Review  Right Eye Distance:   Left Eye Distance:   Bilateral Distance:    Right Eye Near:   Left Eye Near:    Bilateral Near:         MDM   1. Vaginal discharge   2. BV (bacterial vaginosis)    Patient is advised to continue home symptomatic treatment. Prescription for metronidazole gel provided to the patient. Patient is advised that if there are new or worsening symptoms or attend the emergency department, or contact primary care provider. Instructions of care provided discharged home in stable condition.  THIS NOTE WAS GENERATED USING A VOICE RECOGNITION SOFTWARE PROGRAM. ALL REASONABLE EFFORTS  WERE MADE TO PROOFREAD THIS DOCUMENT FOR ACCURACY.     Tharon AquasFrank C Patrick, PA 12/19/15 [phone]

## 2015-12-20 LAB — CERVICOVAGINAL ANCILLARY ONLY: WET PREP (BD AFFIRM): NEGATIVE

## 2015-12-23 ENCOUNTER — Emergency Department (HOSPITAL_BASED_OUTPATIENT_CLINIC_OR_DEPARTMENT_OTHER)
Admission: EM | Admit: 2015-12-23 | Discharge: 2015-12-24 | Disposition: A | Discharge status: Home / Self Care | Payer: BLUE CROSS/BLUE SHIELD | Attending: Emergency Medicine | Admitting: Emergency Medicine

## 2015-12-23 ENCOUNTER — Encounter (HOSPITAL_BASED_OUTPATIENT_CLINIC_OR_DEPARTMENT_OTHER): Payer: Self-pay | Admitting: *Deleted

## 2015-12-23 DIAGNOSIS — Z202 Contact with and (suspected) exposure to infections with a predominantly sexual mode of transmission: Secondary | ICD-10-CM

## 2015-12-23 DIAGNOSIS — Z3202 Encounter for pregnancy test, result negative: Secondary | ICD-10-CM | POA: Diagnosis not present

## 2015-12-23 DIAGNOSIS — N898 Other specified noninflammatory disorders of vagina: Secondary | ICD-10-CM | POA: Diagnosis present

## 2015-12-23 DIAGNOSIS — Z792 Long term (current) use of antibiotics: Secondary | ICD-10-CM | POA: Insufficient documentation

## 2015-12-23 DIAGNOSIS — J45909 Unspecified asthma, uncomplicated: Secondary | ICD-10-CM | POA: Insufficient documentation

## 2015-12-23 LAB — URINALYSIS, ROUTINE W REFLEX MICROSCOPIC
Bilirubin Urine: NEGATIVE
Glucose, UA: NEGATIVE mg/dL
Hgb urine dipstick: NEGATIVE
Ketones, ur: 15 mg/dL — AB
LEUKOCYTES UA: NEGATIVE
Nitrite: NEGATIVE
PH: 6 (ref 5.0–8.0)
Protein, ur: NEGATIVE mg/dL
Specific Gravity, Urine: 1.019 (ref 1.005–1.030)

## 2015-12-23 LAB — PREGNANCY, URINE: Preg Test, Ur: NEGATIVE

## 2015-12-23 MED ORDER — LIDOCAINE HCL (PF) 1 % IJ SOLN
INTRAMUSCULAR | Status: AC
  Administered 2015-12-23: 5 mL
  Filled 2015-12-23: qty 5

## 2015-12-23 MED ORDER — AZITHROMYCIN 250 MG PO TABS
1000.0000 mg | ORAL_TABLET | Freq: Once | ORAL | Status: AC
  Administered 2015-12-23: 1000 mg via ORAL
  Filled 2015-12-23: qty 4

## 2015-12-23 MED ORDER — CEFTRIAXONE SODIUM 250 MG IJ SOLR
250.0000 mg | Freq: Once | INTRAMUSCULAR | Status: AC
  Administered 2015-12-23: 250 mg via INTRAMUSCULAR
  Filled 2015-12-23: qty 250

## 2015-12-23 NOTE — ED Notes (Signed)
States she is being treated for BV. Her sexual partner told her he has Herpes and now she is having vaginal burning. She is unsure if it is in her head since he told her.

## 2015-12-23 NOTE — Discharge Instructions (Signed)
Sexually Transmitted Disease °A sexually transmitted disease (STD) is a disease or infection that may be passed (transmitted) from person to person, usually during sexual activity. This may happen by way of saliva, semen, blood, vaginal mucus, or urine. Common STDs include: °· Gonorrhea. °· Chlamydia. °· Syphilis. °· HIV and AIDS. °· Genital herpes. °· Hepatitis B and C. °· Trichomonas. °· Human papillomavirus (HPV). °· Pubic lice. °· Scabies. °· Mites. °· Bacterial vaginosis. °WHAT ARE CAUSES OF STDs? °An STD may be caused by bacteria, a virus, or parasites. STDs are often transmitted during sexual activity if one person is infected. However, they may also be transmitted through nonsexual means. STDs may be transmitted after:  °· Sexual intercourse with an infected person. °· Sharing sex toys with an infected person. °· Sharing needles with an infected person or using unclean piercing or tattoo needles. °· Having intimate contact with the genitals, mouth, or rectal areas of an infected person. °· Exposure to infected fluids during birth. °WHAT ARE THE SIGNS AND SYMPTOMS OF STDs? °Different STDs have different symptoms. Some people may not have any symptoms. If symptoms are present, they may include: °· Painful or bloody urination. °· Pain in the pelvis, abdomen, vagina, anus, throat, or eyes. °· A skin rash, itching, or irritation. °· Growths, ulcerations, blisters, or sores in the genital and anal areas. °· Abnormal vaginal discharge with or without bad odor. °· Penile discharge in men. °· Fever. °· Pain or bleeding during sexual intercourse. °· Swollen glands in the groin area. °· Yellow skin and eyes (jaundice). This is seen with hepatitis. °· Swollen testicles. °· Infertility. °· Sores and blisters in the mouth. °HOW ARE STDs DIAGNOSED? °To make a diagnosis, your health care provider may: °· Take a medical history. °· Perform a physical exam. °· Take a sample of any discharge to examine. °· Swab the throat,  cervix, opening to the penis, rectum, or vagina for testing. °· Test a sample of your first morning urine. °· Perform blood tests. °· Perform a Pap test, if this applies. °· Perform a colposcopy. °· Perform a laparoscopy. °HOW ARE STDs TREATED? °Treatment depends on the STD. Some STDs may be treated but not cured. °· Chlamydia, gonorrhea, trichomonas, and syphilis can be cured with antibiotic medicine. °· Genital herpes, hepatitis, and HIV can be treated, but not cured, with prescribed medicines. The medicines lessen symptoms. °· Genital warts from HPV can be treated with medicine or by freezing, burning (electrocautery), or surgery. Warts may come back. °· HPV cannot be cured with medicine or surgery. However, abnormal areas may be removed from the cervix, vagina, or vulva. °· If your diagnosis is confirmed, your recent sexual partners need treatment. This is true even if they are symptom-free or have a negative culture or evaluation. They should not have sex until their health care providers say it is okay. °· Your health care provider may test you for infection again 3 months after treatment. °HOW CAN I REDUCE MY RISK OF GETTING AN STD? °Take these steps to reduce your risk of getting an STD: °· Use latex condoms, dental dams, and water-soluble lubricants during sexual activity. Do not use petroleum jelly or oils. °· Avoid having multiple sex partners. °· Do not have sex with someone who has other sex partners °· Do not have sex with anyone you do not know or who is at high risk for an STD. °· Avoid risky sex practices that can break your skin. °· Do not have sex   if you have open sores on your mouth or skin.  Avoid drinking too much alcohol or taking illegal drugs. Alcohol and drugs can affect your judgment and put you in a vulnerable position.  Avoid engaging in oral and anal sex acts.  Get vaccinated for HPV and hepatitis. If you have not received these vaccines in the past, talk to your health care  provider about whether one or both might be right for you.  If you are at risk of being infected with HIV, it is recommended that you take a prescription medicine daily to prevent HIV infection. This is called pre-exposure prophylaxis (PrEP). You are considered at risk if:  You are a man who has sex with other men (MSM).  You are a heterosexual man or woman and are sexually active with more than one partner.  You take drugs by injection.  You are sexually active with a partner who has HIV.  Talk with your health care provider about whether you are at high risk of being infected with HIV. If you choose to begin PrEP, you should first be tested for HIV. You should then be tested every 3 months for as long as you are taking PrEP. WHAT SHOULD I DO IF I THINK I HAVE AN STD?  See your health care provider.  Tell your sexual partner(s). They should be tested and treated for any STDs.  Do not have sex until your health care provider says it is okay. WHEN SHOULD I GET IMMEDIATE MEDICAL CARE? Contact your health care provider right away if:   You have severe abdominal pain.  You are a man and notice swelling or pain in your testicles.  You are a woman and notice swelling or pain in your vagina.   This information is not intended to replace advice given to you by your health care provider. Make sure you discuss any questions you have with your health care provider.   Document Released: 03/02/2003 Document Revised: 12/31/2014 Document Reviewed: 06/30/2013 Elsevier Interactive Patient Education 2016 [employer]   Herpes Simplex Test There are two common types of herpes simplex virus (HSV). These are classified as Type 1 (HSV1) or Type 2 (HSV2). Type 1 often causes cold sores on or around the mouth and sometimes on or around the eyes. Type 2 is commonly known as a sexually transmitted infection that causes sores in and around the genitals. Both types of herpes simplex can cause sores in  different areas. There are two types of herpes simplex tests. These include:  Culture. This consists of collecting and testing a sample of fluid with a cotton swab from an open sore. This can only be done during an active infection (outbreak). Culture tests take several days to complete but are very accurate.  HSV blood tests. This test is not as accurate as a culture. However, HSV blood tests are faster than cultures, often providing a test result within one day.  HSV antibody test. This checks for the presence of antibodies against HSV in your blood. Antibodies are proteins your body makes to help fight infection.  HSV antigen test. This checks for the presence of the HSV germ (antigen) in your blood. Your health care provider may recommend you have a HSV test if:  He or she believes you have a HSV infection.  You have a weakened immune system (immunocompromised) and you have sores around your mouth or genitals that look like HSV eruptions.  You have a fever of unknown origin (FUO).  You are pregnant, have herpes, and are expecting to deliver a baby vaginally in the next 6-8 weeks. RESULTS It is your responsibility to obtain your test results. Ask the lab or department performing the test when and how you will get your results. Contact your health care provider to discuss any questions you have about your results. Range of Normal Values Ranges for normal values may vary among different labs and hospitals. You should always check with your health care provider after having lab work or other tests done to discuss whether your values are considered within normal limits. Normal findings include:  No HSV antigen or antibodies present in your blood.  No HSV antigen present in cultured fluid. Meaning of Results Outside Normal Ranges The following test results may indicate that you have an active HSV infection:  Positive culture for HSV1 or HSV2.  Presence of HSV1 or HSV2 antigens in your  blood.  Presence of certain HSV1 or HSV2 antibodies (IgM) in your blood. Discuss your test results with your health care provider. He or she will use the results to make a diagnosis and determine a treatment plan that is right for you.   This information is not intended to replace advice given to you by your health care provider. Make sure you discuss any questions you have with your health care provider.   Document Released: 01/12/2005 Document Revised: 12/31/2014 Document Reviewed: 04/27/2014 Elsevier Interactive Patient Education [employer]

## 2015-12-23 NOTE — ED Notes (Signed)
C/o vaginal burning and burning w urination,  States partner is being treated for herpes and she wants to be checked

## 2015-12-23 NOTE — ED Provider Notes (Signed)
CSN: [id number]     Arrival date & time 12/23/15  2149 History  By signing my name below, I, Emmanuella Mensah, attest that this documentation has been prepared under the direction and in the presence of Margarita Grizzle, MD. Electronically Signed: Angelene Giovanni, ED Scribe. 12/23/2015. 11:04 PM.    Chief Complaint  Patient presents with  . Vaginal Burning    The history is provided by the patient. No language interpreter was used.   HPI Comments: Anita Bush is a 24 y.o. female who presents to the Emergency Department complaining of moderate ongoing vaginal burning onset today. She states that she is here because one of her sexual partners informed her that he was recently diagnosed with herpes. She reports that she currently has 2 sexual partners and has had 8 sexual partners in her lifetime. She states that she had STI testing at school on 11/21/15 and told she had recurrent BV. She states that she was recently seen at Urgent Care on 12/19/15 and was diagnosed and treated for BV with metronidazole. She denies being tested for Chlamydia or Gonorrhea at the Urgent Care. She adds that she finished her medication 2 days ago.   Past Medical History  Diagnosis Date  . Asthma   . Seasonal allergies    Past Surgical History  Procedure Laterality Date  . Unremarkable.     Family History  Problem Relation Age of Onset  . Hypertension Mother     Living  . Hypertension Father     Living  . Arthritis Mother   . Hyperlipidemia Mother   . Asthma Mother   . Hypertension Maternal Grandmother   . Arthritis Maternal Grandmother   . Hyperlipidemia Maternal Grandmother   . Diabetes Maternal Grandmother     Pre-diabetes  . Allergy (severe) Brother   . Allergy (severe) Sister    Social History  Substance Use Topics  . Smoking status: Never Smoker   . Smokeless tobacco: Never Used  . Alcohol Use: Yes     Comment: social -- weekends   OB History    No data available     Review of Systems   Constitutional: Negative for fever.  Genitourinary: Positive for vaginal pain (vaginal burning). Negative for vaginal bleeding and vaginal discharge.  All other systems reviewed and are negative.     Allergies  Review of patient's allergies indicates no known allergies.  Home Medications   Prior to Admission medications   Medication Sig Start Date End Date Taking? Authorizing Provider  metroNIDAZOLE (METROGEL VAGINAL) 0.75 % vaginal gel Place 1 Applicatorful vaginally 2 (two) times daily. 12/19/15   Tharon Aquas, PA   BP 125/90 mmHg  Pulse 71  Temp(Src) 98.5 F (36.9 C) (Oral)  Resp 18  Ht 5' 6.5" (1.689 m)  Wt 165 lb (74.844 kg)  BMI 26.24 kg/m2  SpO2 99%  LMP 12/09/2015 Physical Exam  Constitutional: She is oriented to person, place, and time. She appears well-developed and well-nourished. No distress.  HENT:  Head: Normocephalic and atraumatic.  Right Ear: External ear normal.  Left Ear: External ear normal.  Nose: Nose normal.  Eyes: Conjunctivae and EOM are normal. Pupils are equal, round, and reactive to light.  Neck: Normal range of motion. Neck supple.  Pulmonary/Chest: Effort normal.  Abdominal: Normal appearance. There is no tenderness.  Genitourinary: Uterus normal. Cervix exhibits discharge. Cervix exhibits no motion tenderness and no friability. Right adnexum displays no mass, no tenderness and no fullness. Left adnexum displays no  mass, no tenderness and no fullness.  Copious thick discharge  Musculoskeletal: Normal range of motion.  Neurological: She is alert and oriented to person, place, and time. She exhibits normal muscle tone. Coordination normal.  Skin: Skin is warm and dry.  Psychiatric: She has a normal mood and affect. Her behavior is normal. Thought content normal.  Nursing note and vitals reviewed.   ED Course  Procedures (including critical care time) DIAGNOSTIC STUDIES: Oxygen Saturation is 99% on RA, normal by my interpretation.     COORDINATION OF CARE: 11:04 PM- Pt advised of plan for treatment and pt agrees.    Labs Review Labs Reviewed  URINALYSIS, ROUTINE W REFLEX MICROSCOPIC (NOT AT [facility]) - Abnormal; Notable for the following:    Ketones, ur 15 (*)    All other components within normal limits  PREGNANCY, URINE  GC/CHLAMYDIA PROBE AMP ([place]) NOT AT [facility]    Margarita Grizzleanielle Leelyn Jasinski, MD has personally reviewed and evaluated these lab results as part of my medical decision-making.  MDM   Final diagnoses:  Possible exposure to STD  24 y.o. Female with ? sti with possible recent herpes exposure.  No lesions noted. Discharge consistent with recent intravaginal meds.  Patient treated empirically for gc and chlamydia.  Discussed f/u with patient for possible serum testing for hsv.   I personally performed the services described in this documentation, which was scribed in my presence. The recorded information has been reviewed and considered.   Margarita Grizzleanielle Trigo Winterbottom, MD 12/24/15 [phone]

## 2015-12-24 ENCOUNTER — Ambulatory Visit: Payer: BLUE CROSS/BLUE SHIELD

## 2015-12-24 LAB — CERVICOVAGINAL ANCILLARY ONLY
CHLAMYDIA, DNA PROBE: NEGATIVE
Neisseria Gonorrhea: NEGATIVE

## 2015-12-26 NOTE — ED Notes (Signed)
Returned call to patient from nurse voice mail. Patient left message giving verbal consent to leave report on her cell phone. All of her reports are negative, and left a message to that effect, w note to cal us if she has further questions

## 2015-12-27 LAB — GC/CHLAMYDIA PROBE AMP (~~LOC~~) NOT AT ARMC
Chlamydia: NEGATIVE
Neisseria Gonorrhea: NEGATIVE

## 2016-01-09 ENCOUNTER — Encounter: Payer: Self-pay | Admitting: Gastroenterology

## 2016-02-27 ENCOUNTER — Ambulatory Visit: Payer: BLUE CROSS/BLUE SHIELD | Admitting: Gastroenterology

## 2016-04-25 ENCOUNTER — Emergency Department (HOSPITAL_BASED_OUTPATIENT_CLINIC_OR_DEPARTMENT_OTHER)
Admission: EM | Admit: 2016-04-25 | Discharge: 2016-04-25 | Disposition: A | Discharge status: Home / Self Care | Payer: BLUE CROSS/BLUE SHIELD | Attending: Dermatology | Admitting: Dermatology

## 2016-04-25 ENCOUNTER — Encounter (HOSPITAL_BASED_OUTPATIENT_CLINIC_OR_DEPARTMENT_OTHER): Payer: Self-pay | Admitting: *Deleted

## 2016-04-25 DIAGNOSIS — R51 Headache: Secondary | ICD-10-CM | POA: Diagnosis present

## 2016-04-25 DIAGNOSIS — J45909 Unspecified asthma, uncomplicated: Secondary | ICD-10-CM | POA: Diagnosis not present

## 2016-04-25 DIAGNOSIS — Z5321 Procedure and treatment not carried out due to patient leaving prior to being seen by health care provider: Secondary | ICD-10-CM | POA: Insufficient documentation

## 2016-04-25 NOTE — ED Notes (Signed)
Reg clerk states pt left

## 2016-04-25 NOTE — ED Notes (Signed)
Pt c/o "h/a" x 3 weeks 

## 2016-04-25 NOTE — ED Notes (Signed)
Did not answer for vital recheck x2

## 2016-04-26 ENCOUNTER — Ambulatory Visit (INDEPENDENT_AMBULATORY_CARE_PROVIDER_SITE_OTHER): Payer: BLUE CROSS/BLUE SHIELD | Admitting: Physician Assistant

## 2016-04-26 VITALS — BP 119/76 | HR 66 | Temp 97.9°F | Resp 16 | Ht 67.0 in | Wt 174.0 lb

## 2016-04-26 DIAGNOSIS — G44219 Episodic tension-type headache, not intractable: Secondary | ICD-10-CM

## 2016-04-26 DIAGNOSIS — J3489 Other specified disorders of nose and nasal sinuses: Secondary | ICD-10-CM

## 2016-04-26 MED ORDER — AMOXICILLIN 875 MG PO TABS: 1650.0000 mg | ORAL_TABLET | Freq: Two times a day (BID) | ORAL | Status: AC

## 2016-04-26 MED ORDER — GUAIFENESIN ER 1200 MG PO TB12: 1.0000 | ORAL_TABLET | Freq: Two times a day (BID) | ORAL | Status: AC

## 2016-04-26 MED ORDER — FLUTICASONE PROPIONATE 50 MCG/ACT NA SUSP: 2.0000 | Freq: Every day | NASAL | Status: DC

## 2016-04-26 NOTE — Patient Instructions (Signed)
     IF you received an x-ray today, you will receive an invoice from [facility]. Please contact  Radiology at [phone] with questions or concerns regarding your invoice.   IF you received labwork today, you will receive an invoice from [facility]. Please contact [facility] at [phone] with questions or concerns regarding your invoice.   Our billing staff will not be able to assist you with questions regarding bills from these companies.  You will be contacted with the lab results as soon as they are available. The fastest way to get your results is to activate your My Chart account. Instructions are located on the last page of this paperwork. If you have not heard from us regarding the results in 2 weeks, please contact this office.      

## 2016-04-26 NOTE — Progress Notes (Signed)
Anita Bush  MRN: [id number] DOB: [birth year]  Subjective:  Pt presents to clinic with about 2 week h/o headaches.  She seems to have a headache daily but she has had several types of headaches since they started.  Started as a headache in the back of her neck that was relieved by a massage.  She also has a headache that is between her eyes and feels like pressure and it gets worse when she bends down.  She has had some allergy problems with sinus pressure and took a few days of mucinex and those symptoms seemed to get better.  She is not having any teeth pain and minimal rhinorrhea.  She is having no dizziness.  Then last night she had a headache that started in the back of her right neck that traveled towards her right ear with some mild photophobia.  She took some motrin and it resolved.  She has also had some headaches that are band like around the head like it is tight.  She typically does not get headaches.  She is stressed with the end of the semester but does not feel like she has muscle spasms or tension in her neck.  Home treatment - Mucinex - a couple of days - cleared up the sore Massage helped the back of head pain Advil helps the pain go away  In school [employer] - getting ready to graduate with a masters in animal health system - plans to go to [employer] school  Family history - none for migraines  Patient Active Problem List   Diagnosis Date Noted  . 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 Tonsillith 05/12/2014    Current Outpatient Prescriptions on File Prior to Visit  Medication Sig Dispense Refill  . ibuprofen (ADVIL,MOTRIN) 200 MG tablet Take 200 mg by mouth every 6 (six) hours as needed.     No current facility-administered medications on file prior to visit.    No Known Allergies  Review of Systems  Constitutional: Negative for fever and chills.  HENT: Positive for postnasal drip and rhinorrhea (yellow then clear). Negative for dental problem. Congestion: mild.     Allergic/Immunologic: Positive for environmental allergies.  Neurological: Positive for headaches. Negative for dizziness.   Objective:  BP 119/76 mmHg  Pulse 66  Temp(Src) 97.9 F (36.6 C) (Oral)  Resp 16  Ht  (1.702 m)  Wt 174 lb (78.926 kg)  BMI 27.25 kg/m2  SpO2 100%  LMP 04/19/2016  Physical Exam  Constitutional: She is oriented to person, place, and time and well-developed, well-nourished, and in no distress.  HENT:  Head: Normocephalic and atraumatic.  Right Ear: Hearing, tympanic membrane, external ear and ear canal normal.  Left Ear: Hearing, tympanic membrane, external ear and ear canal normal.  Nose: Mucosal edema (red swollen) present.  Mouth/Throat: Uvula is midline, oropharynx is clear and moist and mucous membranes are normal.  Eyes: Conjunctivae and EOM are normal. Pupils are equal, round, and reactive to light.  Neck: Normal range of motion.  Cardiovascular: Normal rate, regular rhythm and normal heart sounds.   No murmur heard. Pulmonary/Chest: Effort normal and breath sounds normal.  Musculoskeletal:       Cervical back: Normal. She exhibits normal range of motion and no spasm.  Lymphadenopathy:    She has cervical adenopathy.       Right cervical: Superficial cervical (palpable but no TTP) adenopathy present. No deep cervical and no posterior cervical adenopathy present.      Left cervical:  Superficial cervical (palpable but no TTP) adenopathy present. No deep cervical and no posterior cervical adenopathy present.  Neurological: She is alert and oriented to person, place, and time. She has normal motor skills, normal sensation, normal strength, normal reflexes and intact cranial nerves. She is not disoriented. She displays no weakness, facial symmetry and normal speech. No sensory deficit. Gait normal. Gait normal.  Skin: Skin is warm and dry.  Psychiatric: Mood, memory, affect and judgment normal.  Vitals reviewed.   Assessment and Plan :  Episodic  tension-type headache, not intractable  Sinus pressure - Plan: amoxicillin (AMOXIL) 875 MG tablet, Guaifenesin (MUCINEX MAXIMUM STRENGTH) 1200 MG TB12, fluticasone (FLONASE) 50 MCG/ACT nasal spray   I suspect this is a combination headache type with tension and possible sinus infection esp with her recent cold symptoms and untreated allergies.  She has a normal neuro exam today and motrin relieves her pain.  We will treat for possible sinus infection and she will continue motrin - she will f/u with me if the headaches do not improve/resolve with this treatment.  If she does not improve we will consider muscle relaxers for possible stress causing tension headaches. She understands and agrees with the plan.    Benny LennertSarah Gwendelyn Lanting PA-C  [facility] 04/26/2016 4:42 PM

## 2016-08-08 ENCOUNTER — Ambulatory Visit: Payer: BLUE CROSS/BLUE SHIELD | Admitting: Adult Health

## 2016-08-08 DIAGNOSIS — Z0289 Encounter for other administrative examinations: Secondary | ICD-10-CM

## 2016-08-15 DIAGNOSIS — B9689 Other specified bacterial agents as the cause of diseases classified elsewhere: Secondary | ICD-10-CM | POA: Insufficient documentation

## 2016-08-15 DIAGNOSIS — N760 Acute vaginitis: Secondary | ICD-10-CM

## 2016-10-10 ENCOUNTER — Encounter: Payer: Self-pay | Admitting: Certified Nurse Midwife

## 2016-10-10 ENCOUNTER — Ambulatory Visit (INDEPENDENT_AMBULATORY_CARE_PROVIDER_SITE_OTHER): Payer: BLUE CROSS/BLUE SHIELD | Admitting: Certified Nurse Midwife

## 2016-10-10 VITALS — BP 102/74 | HR 68 | Resp 16 | Ht 66.25 in | Wt 172.0 lb

## 2016-10-10 DIAGNOSIS — N761 Subacute and chronic vaginitis: Secondary | ICD-10-CM

## 2016-10-10 DIAGNOSIS — N760 Acute vaginitis: Secondary | ICD-10-CM

## 2016-10-10 NOTE — Progress Notes (Signed)
25 y.o. Single African American female G0P0000 here to establish gyn care and for problem of chronic BV with symptoms 2-3 days ago again and just finished period.  Treated self with Metrogel as instructed after period. Feels very irritated. Denies any vaginal odor.. Was being seen at [facility] with no change over the past year. Very frustrated. Has been treated with Cleocin as well. Goes away for one month and reoccurs. Denies new personal products or vaginal dryness. Uses Dove soap only. No detergent changes.  No STD concerns. Urinary symptoms none. . Contraception is condoms, not always consistent, but not sexually active at present. No diabetes history. Does not douche. Had aex earlier in year with normal pap per patient. STD screening previously negative. No other health concerns today.   O:Healthy female WDWN Affect: normal, orientation x 3  Exam: Abdomen:soft, non tender  Inguinal Lymph node: no enlargement or tenderness Pelvic exam: External genital: normal female with slight increase pink in vulva area, no scaling or exudate or lesion noted BUS: negative Vagina: scant brown  discharge noted. Ph:4.0   ,Wet prep taken, Cervix: normal, non tender, no CMT Uterus: normal, non tender Adnexa:normal, non tender, no masses or fullness noted   Wet Prep results:Saline,KOH negative for yeast, one clue cell noted   A:Normal pelvic exam History of chronic BV with poly medication use   P:Discussed findings of normal ph  and etiology of alkaline ph when BV present. Discussed one clue cell does not indicate infection. Discussed Aveeno  soda sitz bath for comfort at this point to see if just external irritation which is her main complaint resolves.. Avoid moist clothes or pads for extended period of time. If working out in gym clothes or swim suits for long periods of time change underwear or bottoms of swimsuit if possible. Coconut Oil use for skin protection prior to activity can be used  to external skin for protection discussed. Discussed medication overuse and BV is normal in the vagina unless symptomatic. Continue oral probiotic, but discontinue Metrogel at this point. Call with update of status in one week. Recheck in office in 2 weeks if needed. Questions addressed at length. Will request records of pap smear and treatment given.   Rv prn, as above

## 2016-10-10 NOTE — Progress Notes (Signed)
Encounter reviewed Jill Jertson, MD   

## 2016-10-10 NOTE — Patient Instructions (Signed)

## 2016-10-12 ENCOUNTER — Telehealth: Payer: Self-pay | Admitting: Certified Nurse Midwife

## 2016-10-12 NOTE — Telephone Encounter (Signed)
Patient states she feels she has a yeast infection and wishes to speak with a nurse.  Please advise    [facility] [address]

## 2016-10-12 NOTE — Telephone Encounter (Signed)
Spoke with patient. Patient was seen in the office on 10/10/2016 with Leota Sauerseborah Leonard CNM for evaluation of chronic BV symptoms.  Wet prep results: Saline, KOH negative for yeast, one clue cell noted. Reports she has been performing aveeno sitz baths are directed. Today she began to have vaginal itching with thick white discharge. Took 1 diflucan that she had from a previous prescription. Asking if she needs an rx for a second Diflucan tablet. Advised patient testing for yeast was negative on 10/10/2016. Advised it can take 72 hours for Diflucan to relieve symptoms. Advised if symptoms persist past 72 hours she will need to be seen in the office for further evaluation. Advised to continue with aveeno sitz bath for comfort. Patient is agreeable and verbalizes understanding.  Routing to provider for final review. Patient agreeable to disposition. Will close encounter.

## 2016-10-15 ENCOUNTER — Telehealth: Payer: Self-pay | Admitting: Certified Nurse Midwife

## 2016-10-15 NOTE — Telephone Encounter (Signed)
Patient send this message requesting an appointment.     I was recommended to do the Aveeno Oatmeal baths for 5 days on my visit of 10/18. I started the baths that night of the 18th and on the 19th I noticed the outside of vagina was white and when I lifted the clitoral hood it was white as well. I started having itching on the outside of vaginal area and slight burn and itch inside vagina. I took fluconazole on 10/20 in the morning. I noticed some relief on the 21st but I still feel a itch here and there and my cervix just feels like its covered with something. Just in case my symptoms do not change from taking the fluconazole by Monday, I want to have a scheduled appointment to be sure that I am seen for the available dates and times.  I spoke with this patient and she asked to talk with a nurse before scheduling an appointment.

## 2016-10-15 NOTE — Telephone Encounter (Signed)
Spoke with patient. Patient states that she has been performing Aveeno oatmeal baths since her office visit on 10/10/2016. Reports having increased vaginal itching and burning on 10/11/2016. On 10/12/2016 she took 1 Diflucan tablet she had left over from a previous rx. Reports her symptoms have decreased, but that she is still experiencing intermittent itching and burning. Burning is mainly associated with urine touching her skin. Denies any urinary symptoms, fever, chills, or lower back pain. Patients wet prep was negative for yeast on 10/10/2016. It did show one clue cell. Advised patient she will need to be seen in the office for further evaluation. Patient is agreeable. Patient requests to see Leota Sauerseborah Leonard CNM only. Aware [name] CNM is out of the office today. Appointment scheduled for tomorrow 10/16/2016 at 8 am with Leota Sauerseborah Leonard CNM. Patient is agreeable to date and time.  Routing to provider for final review. Patient agreeable to disposition. Will close encounter.

## 2016-10-16 ENCOUNTER — Encounter: Payer: Self-pay | Admitting: Certified Nurse Midwife

## 2016-10-16 ENCOUNTER — Ambulatory Visit (INDEPENDENT_AMBULATORY_CARE_PROVIDER_SITE_OTHER): Payer: BLUE CROSS/BLUE SHIELD | Admitting: Certified Nurse Midwife

## 2016-10-16 VITALS — BP 110/72 | HR 70 | Resp 16 | Ht 66.25 in | Wt 173.0 lb

## 2016-10-16 DIAGNOSIS — B373 Candidiasis of vulva and vagina: Secondary | ICD-10-CM

## 2016-10-16 DIAGNOSIS — B3731 Acute candidiasis of vulva and vagina: Secondary | ICD-10-CM

## 2016-10-16 MED ORDER — NYSTATIN-TRIAMCINOLONE 100000-0.1 UNIT/GM-% EX OINT: 1.0000 "application " | TOPICAL_OINTMENT | Freq: Two times a day (BID) | CUTANEOUS | 1 refills | Status: DC

## 2016-10-16 NOTE — Progress Notes (Signed)
25 y.o. Single African American female G0P0000 here with complaint of external slight burning when urine touches only and slight irritation on vulva Noted this after working out in the gym. Went home and changed clothes and felt better.Marland Kitchen.  Describes vaginal discharge as normal.. Onset of symptoms 2 days ago. Denies new personal products, had stopped all products but Aveeno sitz bath, which has helped with all vaginal symptoms. Took one Diflucan 4 days ago and this helped. Slight yeast smell, but none today. Not STD concerns. Urinary symptoms none, except when urine touches the skin. . Contraception not sexually active.    O:Healthy female WDWN Affect: normal, orientation x 3  Exam: Abdomen:soft  inguinal Lymph node: no enlargement or tenderness Pelvic exam: External genital: normal female, with slight increase pink, no scaling or lesions, wet prep taken BUS: negative Vagina: normal scant amount discharge noted. Ph:4.0 ,Wet prep taken, Pelvic exam not done per request unless needed  Wet Prep results:KOH, Saline negative vaginally External: positive for yeast only two buds seen  A:Normal external vulva and vaginal exam Yeast vulvitis Contraception not sexually active  P:Discussed findings of yeast vulvitis and etiology. Discussed continuing Aveeno sitz bath, prn for comfort. Avoid moist clothes or pads for extended period of time. If working out in gym clothes  long periods of time change underwear if possible. Coconut Oil use for skin protection prior to activity can be used to external skin for protection or dryness. Discussed Mycolog use to treat and prn only with itching due to excess use which can thin skin. Patient voiced understanding. Discussed Aveeno Eczema cream for just itching if needed after treating. Questions addressed. Rx: Mycolog see order with instructions  Rv prn

## 2016-10-19 NOTE — Progress Notes (Signed)
Encounter reviewed Keymiah Lyles, MD   

## 2016-10-26 ENCOUNTER — Ambulatory Visit: Payer: BLUE CROSS/BLUE SHIELD | Admitting: Certified Nurse Midwife

## 2016-10-26 ENCOUNTER — Telehealth: Payer: Self-pay | Admitting: Certified Nurse Midwife

## 2016-10-26 NOTE — Telephone Encounter (Signed)
Patients mother was rushed to the hospital so she will not be able to make her 10:30 appointment today

## 2016-10-26 NOTE — Telephone Encounter (Signed)
Needs to be on aex recall

## 2016-10-31 ENCOUNTER — Telehealth: Payer: Self-pay | Admitting: Certified Nurse Midwife

## 2016-10-31 NOTE — Telephone Encounter (Signed)
Patient canceled her appointment for two week recheck 11/01/16. Patient to call later to reschedule.

## 2016-11-01 ENCOUNTER — Ambulatory Visit: Payer: BLUE CROSS/BLUE SHIELD | Admitting: Certified Nurse Midwife

## 2016-11-12 ENCOUNTER — Encounter: Payer: Self-pay | Admitting: Nurse Practitioner

## 2016-11-12 ENCOUNTER — Telehealth: Payer: Self-pay | Admitting: Certified Nurse Midwife

## 2016-11-12 ENCOUNTER — Ambulatory Visit (INDEPENDENT_AMBULATORY_CARE_PROVIDER_SITE_OTHER): Payer: BLUE CROSS/BLUE SHIELD | Admitting: Nurse Practitioner

## 2016-11-12 VITALS — BP 120/80 | HR 64 | Resp 12 | Wt 176.0 lb

## 2016-11-12 DIAGNOSIS — N760 Acute vaginitis: Secondary | ICD-10-CM

## 2016-11-12 LAB — POCT URINALYSIS DIPSTICK
Bilirubin, UA: NEGATIVE
Blood, UA: NEGATIVE
Glucose, UA: NEGATIVE
KETONES UA: NEGATIVE
LEUKOCYTES UA: NEGATIVE
Nitrite, UA: NEGATIVE
PH UA: 5
PROTEIN UA: NEGATIVE
Urobilinogen, UA: NEGATIVE

## 2016-11-12 NOTE — Progress Notes (Signed)
25 y.o. Single African American female G0P0000 here with complaint of vaginal symptoms of itching, burning, and increase discharge. Describes discharge as brown to yellow.  Just ended last menses 3 days ago.  LMP: 11/04/16   Onset of symptoms 2 days ago. Denies new personal products or vaginal dryness.   No STD concerns as she is not SA X 2 months.   Last STD's end of September were negative.   Urinary symptoms none . Contraception is condoms.   O:  Healthy female WDWN Affect: normal, orientation x 3  Exam: no distress Abdomen: soft and non tender Lymph node: no enlargement or tenderness Pelvic exam: External genital: normal female BUS: negative Vagina: brown to yellow thick discharge noted.  Affirm taken. Cervix: normal, non tender, no CMT Uterus: normal, non tender Adnexa:normal, non tender, no masses or fullness noted  Urine:  negative  A: R/O Vaginitis   P: Discussed findings of vaginitis and etiology. Discussed Aveeno or baking soda sitz bath for comfort. Avoid moist clothes or pads for extended period of time. If working out in gym clothes or swim suits for long periods of time change underwear or bottoms of swimsuit if possible. Olive Oil/Coconut Oil use for skin protection prior to activity can be used to external skin.  Rx: await test results  Follow with Affirm  RV prn

## 2016-11-12 NOTE — Patient Instructions (Signed)

## 2016-11-12 NOTE — Telephone Encounter (Signed)
Spoke with patient. Patient request OV for vaginal burning and itching. Patient states LMP 11/04/16 followed by brown discharge, vaginal burning and itching. Patient states she trieb oatmeal bath 11/20 in the morning and the itch was relieve somewhat, but burning still there. Patient reports no urinary complaints other than when she voids the urine touching skin burns. Advised patient [name], CNM out of the office today, could schedule with covering provider; patient scheduled for 11/20 at 12:45pm with [name], NP. Patient is agreeable to date and time.   Routing to provider for final review. Patient is agreeable to disposition. Will close encounter.  Cc: Leota Sauerseborah Leonard, CNM

## 2016-11-12 NOTE — Telephone Encounter (Signed)
Patient is asking to tal with a nurse about her vaginal irritation.

## 2016-11-13 ENCOUNTER — Ambulatory Visit: Payer: BLUE CROSS/BLUE SHIELD | Admitting: Certified Nurse Midwife

## 2016-11-13 ENCOUNTER — Telehealth: Payer: Self-pay | Admitting: Certified Nurse Midwife

## 2016-11-13 LAB — WET PREP BY MOLECULAR PROBE
Candida species: NEGATIVE
Gardnerella vaginalis: NEGATIVE
TRICHOMONAS VAG: NEGATIVE

## 2016-11-13 NOTE — Telephone Encounter (Signed)
[name], FNP okay to advised patient urinalyis and wet prep are negative. Anything further for this patient?

## 2016-11-13 NOTE — Telephone Encounter (Signed)
Routing to Patricia Grubb, FNP for review and advise. 

## 2016-11-13 NOTE — Telephone Encounter (Signed)
Patient called requesting her lab results from yesterday. She said, "I am working today. Please leave details on my phone."

## 2016-11-13 NOTE — Telephone Encounter (Signed)
Have pt to use Aveeno and an antifungal cream without any steroids to external area prn for comfort.

## 2016-11-13 NOTE — Telephone Encounter (Signed)
Patient is still having irritation and was told to use Aveeno. Patient is wondering if she needs to use the regular cream or the one with the steroid. Patient said you may leave a detailed message on her voicemail or through MyChart.

## 2016-11-14 ENCOUNTER — Telehealth: Payer: Self-pay

## 2016-11-14 NOTE — Telephone Encounter (Signed)
Spoke with patient at time of incoming call. Patient has concerns about increased white vaginal discharge that started today. States she is unable to use Monistat as it causes burning with use. Denies any vaginal itching. "I just feel uncomfortable." Patient is requesting Diflucan. Advised patient with negative affirm testing treatment with Diflucan is not indicated. Asked to place patient on hold to speak with [name], FNP. Patient is agreeable. Phone call transferred to [name], FNP to speak with patient.

## 2016-11-14 NOTE — Telephone Encounter (Signed)
Spoke with patient. Advised of message as seen below from [name], FNP. Patient is agreeable and verbalizes understanding. Patient has a follow up appointment on 11/21/2016 at 10 am with Leota Sauerseborah Leonard CNM. Patient will return call if symptoms worsen or develops any new symptoms.  Routing to provider for final review. Patient agreeable to disposition. Will close encounter.

## 2016-11-14 NOTE — Telephone Encounter (Signed)
Pt was told that for external symptoms she may use the Mycolog.  She may also use Aveeno baths prn.  Having talked with her seemed to make her feel less stressed and should do OK.  She does not need RX treatment for yeast or BV as her Affirm test was negative.

## 2016-11-14 NOTE — Telephone Encounter (Signed)
She has an RX of Mycolog that is a mixture of both steroid and antifungal - so this one is OK.  If she had other steroids either OTC or RX then I would not advise.    Since her Affirm was negative for yeast internally - not sure if she wants to get Monistat now or just wait.

## 2016-11-14 NOTE — Telephone Encounter (Signed)
Left detailed message at number provided [phone], okay per ROI. Advised of normal test results and that [name], FNP would like her to continue using Aveeno and antifungal cream externally for irritation, but she should not use steroid cream at this time. Advised to return call to the office with any further questions. MyChart message also sent to patient.  Routing to provider for final review. Patient agreeable to disposition. Will close encounter.

## 2016-11-14 NOTE — Telephone Encounter (Signed)
Spoke with patient at time of incoming call. Patient is returning call. Please see telephone encounter dated 11/13/2016. Advised of message and results from [name], FNP. Patient is agreeable. Patient is asking for OTC recommendations for antifungal or rx for antifungal medication. States her antifungal that she has is mixed with her steroid cream. Advised I will speak with [name], FNP regarding recommendations and return call. Patient is agreeable.

## 2016-11-14 NOTE — Progress Notes (Signed)
Reviewed personally.  M. Suzanne Carita Sollars, MD.  

## 2016-11-19 ENCOUNTER — Telehealth: Payer: Self-pay | Admitting: Certified Nurse Midwife

## 2016-11-19 NOTE — Telephone Encounter (Signed)
Called patient and left a message to call back re:  Appointment Request From: Cattleya B. Lucchesi    With Provider: Casimer BilisLEONARD,DEBORAH, CNM [[facility]]    Preferred Date Range: From 11/20/2016 To 11/21/2016    Preferred Times: Tuesday Morning, Wednesday Morning    Reason for visit: Office Visit    Comments:  Tuesday or Wednesday I would like to have the earliest appointment. I prefer to be seen on Tuesday but I do have an appointment on Wednesday just want an earlier time if possible.

## 2016-11-20 ENCOUNTER — Encounter: Payer: Self-pay | Admitting: Certified Nurse Midwife

## 2016-11-20 ENCOUNTER — Ambulatory Visit (INDEPENDENT_AMBULATORY_CARE_PROVIDER_SITE_OTHER): Payer: BLUE CROSS/BLUE SHIELD | Admitting: Certified Nurse Midwife

## 2016-11-20 VITALS — BP 114/74 | HR 70 | Resp 16 | Ht 66.25 in | Wt 171.0 lb

## 2016-11-20 DIAGNOSIS — N898 Other specified noninflammatory disorders of vagina: Secondary | ICD-10-CM

## 2016-11-20 NOTE — Progress Notes (Signed)
25 y.o. Single African American female G0P0000 here with complaint of vaginal symptoms of itching, burning, on outside  Describes discharge as normal appearing discharge this am. Took Diflucan 5 days ago and had some relief from internal itching..Onset of symptoms 7 days ago. Denies new personal products,some vaginal dryness. No STD concerns. Urinary symptoms none . Contraception is Condoms.  O:Healthy female WDWN Affect: normal, orientation x 3  Wet Prep KOH,Saline negative  Exam: Abdomen:soft, nontender  Inguinal Lymph node: no enlargement or tenderness Pelvic exam: External genital: normal female, no scaling or exudate, wet prep taken BUS: negative Vagina: scant discharge noted. Ph:4.0    ,Wet prep taken,  Cervix: normal, non tender, no CMT Uterus: normal, non tender Adnexa:normal, non tender, no masses or fullness noted   Wet Prep results:negative for pathogens   A:Normal pelvic exam History of chronic BV, wet prep negative   P:Discussed findings of negative wet prep.  Discussed Aveeno or baking soda sitz bath for comfort. Discussed vaginal dryness related to heavier clothes and dry skin. Discussed coconut oil us externally for comfort. Instructions given. Will advise if no change. Questions addressed.  Rv prn

## 2016-11-21 ENCOUNTER — Ambulatory Visit: Payer: BLUE CROSS/BLUE SHIELD | Admitting: Certified Nurse Midwife

## 2016-11-23 NOTE — Progress Notes (Signed)
Encounter reviewed Anita Hentges, MD   

## 2016-11-28 ENCOUNTER — Ambulatory Visit (INDEPENDENT_AMBULATORY_CARE_PROVIDER_SITE_OTHER): Payer: BLUE CROSS/BLUE SHIELD | Admitting: Physician Assistant

## 2016-11-28 ENCOUNTER — Ambulatory Visit: Payer: BLUE CROSS/BLUE SHIELD | Admitting: Family Medicine

## 2016-11-28 ENCOUNTER — Ambulatory Visit (INDEPENDENT_AMBULATORY_CARE_PROVIDER_SITE_OTHER): Payer: BLUE CROSS/BLUE SHIELD | Admitting: Certified Nurse Midwife

## 2016-11-28 ENCOUNTER — Encounter: Payer: Self-pay | Admitting: Certified Nurse Midwife

## 2016-11-28 VITALS — BP 108/64 | HR 68 | Resp 16 | Ht 66.25 in | Wt 169.0 lb

## 2016-11-28 VITALS — BP 96/66 | HR 63 | Temp 99.0°F | Resp 16 | Ht 66.0 in | Wt 171.0 lb

## 2016-11-28 DIAGNOSIS — Z111 Encounter for screening for respiratory tuberculosis: Secondary | ICD-10-CM

## 2016-11-28 DIAGNOSIS — Z113 Encounter for screening for infections with a predominantly sexual mode of transmission: Secondary | ICD-10-CM | POA: Diagnosis not present

## 2016-11-28 NOTE — Progress Notes (Signed)

## 2016-11-28 NOTE — Patient Instructions (Signed)
     IF you received an x-ray today, you will receive an invoice from [facility]. Please contact [facility] at [phone] with questions or concerns regarding your invoice.   IF you received labwork today, you will receive an invoice from [facility]. Please contact [facility] at [phone] with questions or concerns regarding your invoice.   Our billing staff will not be able to assist you with questions regarding bills from these companies.  You will be contacted with the lab results as soon as they are available. The fastest way to get your results is to activate your My Chart account. Instructions are located on the last page of this paperwork. If you have not heard from us regarding the results in 2 weeks, please contact this office.      

## 2016-11-28 NOTE — Progress Notes (Signed)
25 y.o. Single African American female G0P0000 here for STD screening. Had new partner recently with condom use, but has not screening in the last 6 months. No vaginal concerns, coconut oil working well for itching in vulva area. No concerns now. Denies pelvic pain, vaginal odor or itching. No other problems today.   O: Healthy WD,WN female Affect: normal, orientation x 3 Skin:warm and dry Abdomen:soft, non tender Pelvic exam:EXTERNAL GENITALIA: normal appearing vulva with no masses, tenderness or lesions VAGINA: no abnormal discharge or lesions, specimens collected CERVIX: no lesions or cervical motion tenderness UTERUS: anteverted, normal, no masses or tenderness ADNEXA: no masses palpable and nontender  A:STD screening Coconut oil working well for vulva itching Normal pelvic exam    P: Discussed findings of normal pelvic exam and external tissue finding. Discussed STD prevention and condoms good choice. Labs: GC,Chlamydia,Hep C, HSV 1,2, HIV,RPR, Hep B  Rv prn     Instructions given regarding:  RV

## 2016-11-28 NOTE — Progress Notes (Signed)
   Anita SermonChloe B Bush  MRN: [id number] DOB: 12/11/1991  PCP: No PCP Per Patient  Subjective:  Pt is a 25 year old female who presents to clinic for TB skin test. 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 She is feeling well today, no complaints. She has never tested positive for Tb. ROS as below.   Review of Systems  Constitutional: Negative for chills, diaphoresis, fatigue and fever.  HENT: Negative for congestion, postnasal drip, rhinorrhea, sinus pressure, sneezing and sore throat.   Respiratory: Negative for cough, chest tightness, shortness of breath and wheezing.   Cardiovascular: Negative for chest pain and palpitations.  Gastrointestinal: Negative for abdominal pain, diarrhea, nausea and vomiting.  Neurological: Negative for dizziness, weakness, light-headedness and headaches.    Patient Active Problem List   Diagnosis Date Noted  . 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 Tonsillith 05/12/2014    Current Outpatient Prescriptions on File Prior to Visit  Medication Sig Dispense Refill  . Multiple Vitamins-Minerals (MULTIVITAMIN PO) Take by mouth.    . Probiotic Product (PROBIOTIC PO) Take by mouth.    Marland Kitchen. UNABLE TO FIND Coconut oil pill     No current facility-administered medications on file prior to visit.     No Known Allergies   Objective:  BP 96/66 (BP Location: Left Arm, Patient Position: Sitting, Cuff Size: Normal)   Pulse 63   Temp 99 F (37.2 C) (Oral)   Resp 16   Ht 5\' 6"  (1.676 m)   Wt 171 lb (77.6 kg)   LMP 11/04/2016   SpO2 99%   BMI 27.60 kg/m   Physical Exam  Constitutional: She is oriented to person, place, and time and well-developed, well-nourished, and in no distress. No distress.  Cardiovascular: Normal rate, regular rhythm and normal heart sounds.   Pulmonary/Chest: Effort normal and breath sounds normal. No respiratory distress. She has no wheezes. She has no rales.  Neurological: She is alert and oriented to person, place, and  time. GCS score is 15.  Skin: Skin is warm and dry.  Psychiatric: Mood, memory, affect and judgment normal.  Vitals reviewed.   Assessment and Plan :  1. Screening-pulmonary TB - TB Skin Test - TB questionnaire performed by CMA - RTC in 48-72 hours for test reading.    Marco CollieWhitney Emmary Culbreath, PA-C  [facility] 11/28/2016 11:30 AM

## 2016-11-28 NOTE — Patient Instructions (Signed)
Sexually Transmitted Disease A sexually transmitted disease (STD) is a disease or infection often passed to another person during sex. However, STDs can be passed through nonsexual ways. An STD can be passed through:  Spit (saliva).  Semen.  Blood.  Mucus from the vagina.  Pee (urine). How can I lessen my chances of getting an STD?  Use:  Latex condoms.  Water-soluble lubricants with condoms. Do not use petroleum jelly or oils.  Dental dams. These are small pieces of latex that are used as a barrier during oral sex.  Avoid having more than one sex partner.  Do not have sex with someone who has other sex partners.  Do not have sex with anyone you do not know or who is at high risk for an STD.  Avoid risky sex that can break your skin.  Do not have sex if you have open sores on your mouth or skin.  Avoid drinking too much alcohol or taking illegal drugs. Alcohol and drugs can affect your good judgment.  Avoid oral and anal sex acts.  Get shots (vaccines) for HPV and hepatitis.  If you are at risk of being infected with HIV, it is advised that you take a certain medicine daily to prevent HIV infection. This is called pre-exposure prophylaxis (PrEP). You may be at risk if:  You are a man who has sex with other men (MSM).  You are attracted to the opposite sex (heterosexual) and are having sex with more than one partner.  You take drugs with a needle.  You have sex with someone who has HIV.  Talk with your doctor about if you are at high risk of being infected with HIV. If you begin to take PrEP, get tested for HIV first. Get tested every 3 months for as long as you are taking PrEP.  Get tested for STDs every year if you are sexually active. If you are treated for an STD, get tested again 3 months after you are treated. What should I do if I think I have an STD?  See your doctor.  Tell your sex partner(s) that you have an STD. They should be tested and treated.  Do  not have sex until your doctor says it is okay. When should I get help? Get help right away if:  You have bad belly (abdominal) pain.  You are a man and have puffiness (swelling) or pain in your testicles.  You are a woman and have puffiness in your vagina. This information is not intended to replace advice given to you by your health care provider. Make sure you discuss any questions you have with your health care provider. Document Released: 01/17/2005 Document Revised: 05/17/2016 Document Reviewed: 06/05/2013 Elsevier Interactive Patient Education  2017 Elsevier Inc.  

## 2016-11-29 LAB — STD PANEL
HIV 1&2 Ab, 4th Generation: NONREACTIVE
Hepatitis B Surface Ag: NEGATIVE

## 2016-11-29 LAB — HSV(HERPES SIMPLEX VRS) I + II AB-IGG

## 2016-11-29 LAB — GC/CHLAMYDIA PROBE AMP
CT PROBE, AMP APTIMA: NOT DETECTED
GC PROBE AMP APTIMA: NOT DETECTED

## 2016-11-29 LAB — HEPATITIS C ANTIBODY: HCV Ab: NEGATIVE

## 2016-11-29 NOTE — Progress Notes (Signed)
Encounter reviewed Jill Jertson, MD   

## 2016-12-25 ENCOUNTER — Telehealth: Payer: Self-pay | Admitting: Certified Nurse Midwife

## 2016-12-25 ENCOUNTER — Ambulatory Visit: Payer: BLUE CROSS/BLUE SHIELD | Admitting: Certified Nurse Midwife

## 2016-12-25 NOTE — Telephone Encounter (Signed)
Patient canceled her appointment for reck and did not reschedule.

## 2016-12-25 NOTE — Telephone Encounter (Signed)
Called patient to reschedule and discuss but could not leave a message due to "voice mail box full."

## 2017-01-22 ENCOUNTER — Telehealth: Payer: Self-pay | Admitting: Certified Nurse Midwife

## 2017-01-22 NOTE — Telephone Encounter (Signed)
Spoke with patient. Patient started her menses on 01/04/2017. Patient did not notice any changes with cycle. On Sunday 01/20/2017 she began to have intermittent sharp pain in her lower pelvic region. Also feeling "pressure in my ovaries." Denies any nausea, vomiting, fever, or change in BM. No pain at this time.Has been having discharge that is thick and clear in color. Not on any form of birth control. Would like [name] CNM's recommendations about if this is related to ovulation or if OV is needed for evaluation. Advised I will review with Leota Sauerseborah Leonard CNM and return call with further recommendations. Patient is agreeable.

## 2017-01-22 NOTE — Telephone Encounter (Signed)
This may be ovulatory, but should be very short occurrence, if discomfort continues needs OV.

## 2017-01-22 NOTE — Telephone Encounter (Signed)
Patient called and said, "I'd like to schedule an appointment.  I have been feeling pain and pressure in my ovaries."  Last seen:11/28/16

## 2017-01-23 NOTE — Telephone Encounter (Signed)
Attempted to reach patient at number provided [phone]. There was no answer and recording states that the voicemail box is full and is not accepting messages at this time.

## 2017-01-23 NOTE — Telephone Encounter (Signed)
Thank you for follow up on patient

## 2017-01-23 NOTE — Telephone Encounter (Signed)
Spoke with patient. Advised of message as seen below from PepsiCoDeborah Leonard CNM. Patient states she is feeling much better this morning. Will monitor discomfort and call to schedule an OV if this persists.  Routing to provider for final review. Patient agreeable to disposition. Will close encounter.

## 2017-02-01 ENCOUNTER — Ambulatory Visit: Payer: BLUE CROSS/BLUE SHIELD | Admitting: Certified Nurse Midwife

## 2017-02-19 ENCOUNTER — Encounter (HOSPITAL_BASED_OUTPATIENT_CLINIC_OR_DEPARTMENT_OTHER): Payer: Self-pay | Admitting: *Deleted

## 2017-02-19 ENCOUNTER — Emergency Department (HOSPITAL_BASED_OUTPATIENT_CLINIC_OR_DEPARTMENT_OTHER)
Admission: EM | Admit: 2017-02-19 | Discharge: 2017-02-19 | Disposition: A | Discharge status: Home / Self Care | Payer: BLUE CROSS/BLUE SHIELD | Attending: Emergency Medicine | Admitting: Emergency Medicine

## 2017-02-19 DIAGNOSIS — J45909 Unspecified asthma, uncomplicated: Secondary | ICD-10-CM | POA: Insufficient documentation

## 2017-02-19 DIAGNOSIS — K219 Gastro-esophageal reflux disease without esophagitis: Secondary | ICD-10-CM | POA: Insufficient documentation

## 2017-02-19 HISTORY — DX: Gastro-esophageal reflux disease without esophagitis: K21.9

## 2017-02-19 LAB — CBC WITH DIFFERENTIAL/PLATELET
Basophils Absolute: 0 10*3/uL (ref 0.0–0.1)
Basophils Relative: 1 %
EOS ABS: 0.2 10*3/uL (ref 0.0–0.7)
EOS PCT: 2 %
HCT: 36.3 % (ref 36.0–46.0)
Hemoglobin: 12.7 g/dL (ref 12.0–15.0)
LYMPHS ABS: 3 10*3/uL (ref 0.7–4.0)
LYMPHS PCT: 42 %
MCH: 32.4 pg (ref 26.0–34.0)
MCHC: 35 g/dL (ref 30.0–36.0)
MCV: 92.6 fL (ref 78.0–100.0)
MONOS PCT: 7 %
Monocytes Absolute: 0.5 10*3/uL (ref 0.1–1.0)
Neutro Abs: 3.4 10*3/uL (ref 1.7–7.7)
Neutrophils Relative %: 48 %
PLATELETS: 298 10*3/uL (ref 150–400)
RBC: 3.92 MIL/uL (ref 3.87–5.11)
RDW: 11.7 % (ref 11.5–15.5)
WBC: 7 10*3/uL (ref 4.0–10.5)

## 2017-02-19 LAB — COMPREHENSIVE METABOLIC PANEL
ALT: 14 U/L (ref 14–54)
ANION GAP: 8 (ref 5–15)
AST: 25 U/L (ref 15–41)
Albumin: 4.3 g/dL (ref 3.5–5.0)
Alkaline Phosphatase: 40 U/L (ref 38–126)
BUN: 8 mg/dL (ref 6–20)
CALCIUM: 9.1 mg/dL (ref 8.9–10.3)
CHLORIDE: 104 mmol/L (ref 101–111)
CO2: 25 mmol/L (ref 22–32)
CREATININE: 0.87 mg/dL (ref 0.44–1.00)
Glucose, Bld: 115 mg/dL — ABNORMAL HIGH (ref 65–99)
Potassium: 3.8 mmol/L (ref 3.5–5.1)
SODIUM: 137 mmol/L (ref 135–145)
Total Bilirubin: 0.6 mg/dL (ref 0.3–1.2)
Total Protein: 7.1 g/dL (ref 6.5–8.1)

## 2017-02-19 LAB — LIPASE, BLOOD: LIPASE: 20 U/L (ref 11–51)

## 2017-02-19 LAB — HCG, SERUM, QUALITATIVE: PREG SERUM: NEGATIVE

## 2017-02-19 MED ORDER — OMEPRAZOLE 20 MG PO CPDR
20.0000 mg | DELAYED_RELEASE_CAPSULE | Freq: Every day | ORAL | 0 refills | Status: DC
Start: 2017-02-19 — End: 2017-05-22

## 2017-02-19 MED ORDER — PANTOPRAZOLE SODIUM 40 MG PO TBEC
40.0000 mg | DELAYED_RELEASE_TABLET | Freq: Every day | ORAL | Status: DC
  Administered 2017-02-19: 40 mg via ORAL
  Filled 2017-02-19: qty 1

## 2017-02-19 NOTE — ED Provider Notes (Signed)
[facility] Provider Note   CSN: [id number] Arrival date & time: 02/19/17  1851  By signing my name below, I, Talbert Nan, attest that this documentation has been prepared under the direction and in the presence of Melburn Hake, [place]. Electronically Signed: Talbert Nan, Scribe. 02/19/17. 7:32 PM.   History   Chief Complaint Chief Complaint  Patient presents with  . Gastroesophageal Reflux    HPI Anita Bush is a 26 y.o. female with h/o asthma, GERD who presents to the Emergency Department complaining of worsening, waxing and waning, intermittent painful burning in chest that started 4 days ago. Patient reports symptoms feel consistent with symptoms she has had in the past related to acid reflux. Pt has been taking calcium carbonate with out relief. Pt had prescription for omeprazole and does not have any at the moment, but it is out of date and she cannot get an appointment with her GI doctor for a few months. Pt has associated sore throat, upper abdominal bloating and pain. Pt denies fever, Cp, SOB, hemoptysis, cough, N/V/D, vaginal bleeding/discharge, dysuria. LMP was 02/04/17. Pt has no hx of surgery.    The history is provided by the patient. No language interpreter was used.    Past Medical History:  Diagnosis Date  . Asthma   . GERD (gastroesophageal reflux disease)   . Seasonal allergies     Patient Active Problem List   Diagnosis Date Noted  . 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 Unremarkable.    Marland Kitchen UPPER GI ENDOSCOPY      OB History    Gravida Para Term Preterm AB Living   0 0 0 0 0 0   SAB TAB Ectopic Multiple Live Births   0 0 0 0 0       Home Medications    Prior to Admission medications   Medication Sig Start Date End Date Taking? Authorizing Provider  Multiple Vitamins-Minerals (MULTIVITAMIN PO) Take by mouth.    Historical Provider, MD  omeprazole (PRILOSEC)  20 MG capsule Take 1 capsule (20 mg total) by mouth daily. 02/19/17   Barrett Henle, PA-C  Probiotic Product (PROBIOTIC PO) Take by mouth.    Historical Provider, MD  UNABLE TO FIND Coconut oil pill    Historical Provider, MD    Family History Family History  Problem Relation Age of Onset  . Hypertension Mother     Living  . Arthritis Mother   . Asthma Mother   . Arthritis Maternal Grandmother   . Heart disease Maternal Grandfather     Social History Social History  Substance Use Topics  . Smoking status: Never Smoker  . Smokeless tobacco: Never Used  . Alcohol use Yes     Comment: 4-9     Allergies   Patient has no known allergies.   Review of Systems Review of Systems  Gastrointestinal: Positive for abdominal pain.  All other systems reviewed and are negative.    Physical Exam Updated Vital Signs BP 109/66   Pulse 67   Temp 98.1 F (36.7 C) (Oral)   Resp 20   Ht 5\' 7"  (1.702 m)   Wt 77.1 kg   LMP 02/04/2017   SpO2 99%   BMI 26.63 kg/m   Physical Exam  Constitutional: She is oriented to person, place, and time. She appears well-developed and well-nourished. No distress.  HENT:  Head: Normocephalic and atraumatic.  Mouth/Throat: Uvula  is midline, oropharynx is clear and moist and mucous membranes are normal. No oropharyngeal exudate, posterior oropharyngeal edema, posterior oropharyngeal erythema or tonsillar abscesses. No tonsillar exudate.  Eyes: Conjunctivae and EOM are normal. Right eye exhibits no discharge. Left eye exhibits no discharge. No scleral icterus.  Neck: Normal range of motion. Neck supple.  Cardiovascular: Normal rate, regular rhythm, normal heart sounds and intact distal pulses.   Pulmonary/Chest: Effort normal and breath sounds normal. No respiratory distress. She has no wheezes. She has no rales. She exhibits no tenderness.  Abdominal: Soft. Normal appearance and bowel sounds are normal. She exhibits no distension and no mass.  There is tenderness. There is no rigidity, no rebound, no guarding, no CVA tenderness and negative Murphy's sign. No hernia.  Mild tenderness over epigastric region.  Musculoskeletal: Normal range of motion. She exhibits no edema.  Lymphadenopathy:    She has no cervical adenopathy.  Neurological: She is alert and oriented to person, place, and time.  Skin: Skin is warm and dry. She is not diaphoretic.  Nursing note and vitals reviewed.    ED Treatments / Results   DIAGNOSTIC STUDIES: Oxygen Saturation is 100% on room air, normal by my interpretation.    COORDINATION OF CARE: 7:27 PM Discussed treatment plan with pt at bedside and pt agreed to plan, XR of abdomen, basic lab work.  Labs (all labs ordered are listed, but only abnormal results are displayed) Labs Reviewed  COMPREHENSIVE METABOLIC PANEL - Abnormal; Notable for the following:       Result Value   Glucose, Bld 115 (*)    All other components within normal limits  CBC WITH DIFFERENTIAL/PLATELET  LIPASE, BLOOD  HCG, SERUM, QUALITATIVE    EKG  EKG Interpretation None       Radiology No results found.  Procedures Procedures (including critical care time)  Medications Ordered in ED Medications  pantoprazole (PROTONIX) EC tablet 40 mg (40 mg Oral Given 02/19/17 1946)     Initial Impression / Assessment and Plan / ED Course  I have reviewed the triage vital signs and the nursing notes.  Pertinent labs & imaging results that were available during my care of the patient were reviewed by me and considered in my medical decision making (see chart for details).     Patient presents with reported burning sensation down her esophagus into her upper abdomen that has been present for the past 4 days. Symptoms consistent with history of acid reflux. Reports running out of omeprazole and been unable to follow up with her GI clinic. No relief with over-the-counter antacids. Denies fever, chest pain, shortness of  breath, vomiting, diarrhea. VSS. Exam revealed mild tenderness over epigastric region, no peritoneal signs. Remaining exam unremarkable. Patient given PPI in the ED. Pregnancy negative. Labs unremarkable. Suspect patient's symptoms are likely related to GERD. On reevaluation patient reports improvement of symptoms. No indication of appendicitis, bowel obstruction, bowel perforation, cholecystitis, diverticulitis, PID or ectopic pregnancy.  Plan to discharge patient home with prescription and for omeprazole and lifestyle changes/diet recommendations. Advised patient to follow up with GI for further management. Discussed return precautions.   Final Clinical Impressions(s) / ED Diagnoses   Final diagnoses:  Gastroesophageal reflux disease, esophagitis presence not specified    New Prescriptions Discharge Medication List as of 02/19/2017  8:38 PM    START taking these medications   Details  omeprazole (PRILOSEC) 20 MG capsule Take 1 capsule (20 mg total) by mouth daily., Starting Tue 02/19/2017, Print  I personally performed the services described in this documentation, which was scribed in my presence. The recorded information has been reviewed and is accurate.     Satira Sark [place], [place] 02/19/17 2042    Vanetta Mulders, MD 02/19/17 [phone]

## 2017-02-19 NOTE — ED Triage Notes (Signed)
Reflux for the past week. Hx of same and has been taking otc antiacids. Abdominal bloating and burning that gets worse when she eats.

## 2017-02-19 NOTE — ED Notes (Signed)
ED Provider at bedside. 

## 2017-02-19 NOTE — Discharge Instructions (Signed)
Continue taking her medication as prescribed. I recommend following the lifestyle changes and diet listed below to help with her symptoms related to your acid reflux. Follow-up with the gastroenterology clinic listed below for further management and evaluation of your reflux. Please return to the Emergency Department if symptoms worsen or new onset of fever, chest pain, difficulty breathing, practice cough, abdominal pain, vomiting, unable to keep fluids down.

## 2017-02-22 ENCOUNTER — Ambulatory Visit (INDEPENDENT_AMBULATORY_CARE_PROVIDER_SITE_OTHER): Payer: Managed Care, Other (non HMO) | Admitting: Certified Nurse Midwife

## 2017-02-22 ENCOUNTER — Ambulatory Visit: Payer: BLUE CROSS/BLUE SHIELD | Admitting: Certified Nurse Midwife

## 2017-02-22 ENCOUNTER — Encounter: Payer: Self-pay | Admitting: Certified Nurse Midwife

## 2017-02-22 VITALS — BP 110/70 | HR 68 | Resp 16 | Ht 66.25 in | Wt 167.0 lb

## 2017-02-22 DIAGNOSIS — Z0000 Encounter for general adult medical examination without abnormal findings: Secondary | ICD-10-CM

## 2017-02-22 DIAGNOSIS — Z01419 Encounter for gynecological examination (general) (routine) without abnormal findings: Secondary | ICD-10-CM | POA: Diagnosis not present

## 2017-02-22 LAB — HIV ANTIBODY (ROUTINE TESTING W REFLEX): HIV 1&2 Ab, 4th Generation: NONREACTIVE

## 2017-02-22 NOTE — Patient Instructions (Signed)
General topics  Next pap or exam is  due in 1 year Take a Women's multivitamin Take 1200 mg. of calcium daily - prefer dietary If any concerns in interim to call back  Breast Self-Awareness Practicing breast self-awareness may pick up problems early, prevent significant medical complications, and possibly save your life. By practicing breast self-awareness, you can become familiar with how your breasts look and feel and if your breasts are changing. This allows you to notice changes early. It can also offer you some reassurance that your breast health is good. One way to learn what is normal for your breasts and whether your breasts are changing is to do a breast self-exam. If you find a lump or something that was not present in the past, it is best to contact your caregiver right away. Other findings that should be evaluated by your caregiver include nipple discharge, especially if it is bloody; skin changes or reddening; areas where the skin seems to be pulled in (retracted); or new lumps and bumps. Breast pain is seldom associated with cancer (malignancy), but should also be evaluated by a caregiver. BREAST SELF-EXAM The best time to examine your breasts is 5 7 days after your menstrual period is over.  ExitCare Patient Information 2013 Exit[employer].   Exercise to Stay Healthy Exercise helps you become and stay healthy. EXERCISE IDEAS AND TIPS Choose exercises that:  You enjoy.  Fit into your day. You do not need to exercise really hard to be healthy. You can do exercises at a slow or medium level and stay healthy. You can:  Stretch before and after working out.  Try yoga, Pilates, or tai chi.  Lift weights.  Walk fast, swim, jog, run, climb stairs, bicycle, dance, or rollerskate.  Take aerobic classes. Exercises that burn about 150 calories:  Running 1  miles in 15 minutes.  Playing volleyball for 45 to 60 minutes.  Washing and waxing a car for 45 to 60  minutes.  Playing touch football for 45 minutes.  Walking 1  miles in 35 minutes.  Pushing a stroller 1  miles in 30 minutes.  Playing basketball for 30 minutes.  Raking leaves for 30 minutes.  Bicycling 5 miles in 30 minutes.  Walking 2 miles in 30 minutes.  Dancing for 30 minutes.  Shoveling snow for 15 minutes.  Swimming laps for 20 minutes.  Walking up stairs for 15 minutes.  Bicycling 4 miles in 15 minutes.  Gardening for 30 to 45 minutes.  Jumping rope for 15 minutes.  Washing windows or floors for 45 to 60 minutes. Document Released: 01/12/2011 Document Revised: 03/03/2012 Document Reviewed: 01/12/2011 ExitCare Patient Information 2013 ExitCare[employer].   Other topics ( that may be useful information):    Sexually Transmitted Disease Sexually transmitted disease (STD) refers to any infection that is passed from person to person during sexual activity. This may happen by way of saliva, semen, blood, vaginal mucus, or urine. Common STDs include:  Gonorrhea.  Chlamydia.  Syphilis.  HIV/AIDS.  Genital herpes.  Hepatitis B and C.  Trichomonas.  Human papillomavirus (HPV).  Pubic lice. CAUSES  An STD may be spread by bacteria, virus, or parasite. A person can get an STD by:  Sexual intercourse with an infected person.  Sharing sex toys with an infected person.  Sharing needles with an infected person.  Having intimate contact with the genitals, mouth, or rectal areas of an infected person. SYMPTOMS  Some people may not have any symptoms, but   they can still pass the infection to others. Different STDs have different symptoms. Symptoms include:  Painful or bloody urination.  Pain in the pelvis, abdomen, vagina, anus, throat, or eyes.  Skin rash, itching, irritation, growths, or sores (lesions). These usually occur in the genital or anal area.  Abnormal vaginal discharge.  Penile discharge in men.  Soft, flesh-colored skin growths in the  genital or anal area.  Fever.  Pain or bleeding during sexual intercourse.  Swollen glands in the groin area.  Yellow skin and eyes (jaundice). This is seen with hepatitis. DIAGNOSIS  To make a diagnosis, your caregiver may:  Take a medical history.  Perform a physical exam.  Take a specimen (culture) to be examined.  Examine a sample of discharge under a microscope.  Perform blood test TREATMENT   Chlamydia, gonorrhea, trichomonas, and syphilis can be cured with antibiotic medicine.  Genital herpes, hepatitis, and HIV can be treated, but not cured, with prescribed medicines. The medicines will lessen the symptoms.  Genital warts from HPV can be treated with medicine or by freezing, burning (electrocautery), or surgery. Warts may come back.  HPV is a virus and cannot be cured with medicine or surgery.However, abnormal areas may be followed very closely by your caregiver and may be removed from the cervix, vagina, or vulva through office procedures or surgery. If your diagnosis is confirmed, your recent sexual partners need treatment. This is true even if they are symptom-free or have a negative culture or evaluation. They should not have sex until their caregiver says it is okay. HOME CARE INSTRUCTIONS  All sexual partners should be informed, tested, and treated for all STDs.  Take your antibiotics as directed. Finish them even if you start to feel better.  Only take over-the-counter or prescription medicines for pain, discomfort, or fever as directed by your caregiver.  Rest.  Eat a balanced diet and drink enough fluids to keep your urine clear or pale yellow.  Do not have sex until treatment is completed and you have followed up with your caregiver. STDs should be checked after treatment.  Keep all follow-up appointments, Pap tests, and blood tests as directed by your caregiver.  Only use latex condoms and water-soluble lubricants during sexual activity. Do not use  petroleum jelly or oils.  Avoid alcohol and illegal drugs.  Get vaccinated for HPV and hepatitis. If you have not received these vaccines in the past, talk to your caregiver about whether one or both might be right for you.  Avoid risky sex practices that can break the skin. The only way to avoid getting an STD is to avoid all sexual activity.Latex condoms and dental dams (for oral sex) will help lessen the risk of getting an STD, but will not completely eliminate the risk. SEEK MEDICAL CARE IF:   You have a fever.  You have any new or worsening symptoms. Document Released: 03/02/2003 Document Revised: 03/03/2012 Document Reviewed: 03/09/2011 [facility] Patient Information 2013 [employer].    Domestic Abuse You are being battered or abused if someone close to you hits, pushes, or physically hurts you in any way. You also are being abused if you are forced into activities. You are being sexually abused if you are forced to have sexual contact of any kind. You are being emotionally abused if you are made to feel worthless or if you are constantly threatened. It is important to remember that help is available. No one has the right to abuse you. PREVENTION OF FURTHER  ABUSE  Learn the warning signs of danger. This varies with situations but may include: the use of alcohol, threats, isolation from friends and family, or forced sexual contact. Leave if you feel that violence is going to occur.  If you are attacked or beaten, report it to the police so the abuse is documented. You do not have to press charges. The police can protect you while you or the attackers are leaving. Get the officer's name and badge number and a copy of the report.  Find someone you can trust and tell them what is happening to you: your caregiver, a nurse, clergy member, close friend or family member. Feeling ashamed is natural, but remember that you have done nothing wrong. No one deserves abuse. Document Released:  12/07/2000 Document Revised: 03/03/2012 Document Reviewed: 02/15/2011 ExitCare Patient Information 2013 [employer].    How Much is Too Much Alcohol? Drinking too much alcohol can cause injury, accidents, and health problems. These types of problems can include:   Car crashes.  Falls.  Family fighting (domestic violence).  Drowning.  Fights.  Injuries.  Burns.  Damage to certain organs.  Having a baby with birth defects. ONE DRINK CAN BE TOO MUCH WHEN YOU ARE:  Working.  Pregnant or breastfeeding.  Taking medicines. Ask your doctor.  Driving or planning to drive. If you or someone you know has a drinking problem, get help from a doctor.  Document Released: 10/06/2009 Document Revised: 03/03/2012 Document Reviewed: 10/06/2009 ExitCare Patient Information 2013 [employer]Care[employer].   Smoking Hazards Smoking cigarettes is extremely bad for your health. Tobacco smoke has over 200 known poisons in it. There are over 60 chemicals in tobacco smoke that cause cancer. Some of the chemicals found in cigarette smoke include:   Cyanide.  Benzene.  Formaldehyde.  Methanol (wood alcohol).  Acetylene (fuel used in welding torches).  Ammonia. Cigarette smoke also contains the poisonous gases nitrogen oxide and carbon monoxide.  Cigarette smokers have an increased risk of many serious medical problems and Smoking causes approximately:  90% of all lung cancer deaths in men.  80% of all lung cancer deaths in women.  90% of deaths from chronic obstructive lung disease. Compared with nonsmokers, smoking increases the risk of:  Coronary heart disease by 2 to 4 times.  Stroke by 2 to 4 times.  Men developing lung cancer by 23 times.  Women developing lung cancer by 13 times.  Dying from chronic obstructive lung diseases by 12 times.  . Smoking is the most preventable cause of death and disease in our society.  WHY IS SMOKING ADDICTIVE?  Nicotine is the chemical  agent in tobacco that is capable of causing addiction or dependence.  When you smoke and inhale, nicotine is absorbed rapidly into the bloodstream through your lungs. Nicotine absorbed through the lungs is capable of creating a powerful addiction. Both inhaled and non-inhaled nicotine may be addictive.  Addiction studies of cigarettes and spit tobacco show that addiction to nicotine occurs mainly during the teen years, when young people begin using tobacco products. WHAT ARE THE BENEFITS OF QUITTING?  There are many health benefits to quitting smoking.   Likelihood of developing cancer and heart disease decreases. Health improvements are seen almost immediately.  Blood pressure, pulse rate, and breathing patterns start returning to normal soon after quitting. QUITTING SMOKING   American Lung Association - 1-800-LUNGUSA  American Cancer Society - 1-800-ACS-2345 Document Released: 01/17/2005 Document Revised: 03/03/2012 Document Reviewed: 09/21/2009 ExitCare Patient Information 2013 ExitCare,   LLC.   Stress Management Stress is a state of physical or mental tension that often results from changes in your life or normal routine. Some common causes of stress are:  Death of a loved one.  Injuries or severe illnesses.  Getting fired or changing jobs.  Moving into a new home. Other causes may be:  Sexual problems.  Business or financial losses.  Taking on a large debt.  Regular conflict with someone at home or at work.  Constant tiredness from lack of sleep. It is not just bad things that are stressful. It may be stressful to:  Win the lottery.  Get married.  Buy a new car. The amount of stress that can be easily tolerated varies from person to person. Changes generally cause stress, regardless of the types of change. Too much stress can affect your health. It may lead to physical or emotional problems. Too little stress (boredom) may also become stressful. SUGGESTIONS TO  REDUCE STRESS:  Talk things over with your family and friends. It often is helpful to share your concerns and worries. If you feel your problem is serious, you may want to get help from a professional counselor.  Consider your problems one at a time instead of lumping them all together. Trying to take care of everything at once may seem impossible. List all the things you need to do and then start with the most important one. Set a goal to accomplish 2 or 3 things each day. If you expect to do too many in a single day you will naturally fail, causing you to feel even more stressed.  Do not use alcohol or drugs to relieve stress. Although you may feel better for a short time, they do not remove the problems that caused the stress. They can also be habit forming.  Exercise regularly - at least 3 times per week. Physical exercise can help to relieve that "uptight" feeling and will relax you.  The shortest distance between despair and hope is often a good night's sleep.  Go to bed and get up on time allowing yourself time for appointments without being rushed.  Take a short "time-out" period from any stressful situation that occurs during the day. Close your eyes and take some deep breaths. Starting with the muscles in your face, tense them, hold it for a few seconds, then relax. Repeat this with the muscles in your neck, shoulders, hand, stomach, back and legs.  Take good care of yourself. Eat a balanced diet and get plenty of rest.  Schedule time for having fun. Take a break from your daily routine to relax. HOME CARE INSTRUCTIONS   Call if you feel overwhelmed by your problems and feel you can no longer manage them on your own.  Return immediately if you feel like hurting yourself or someone else. Document Released: 06/05/2001 Document Revised: 03/03/2012 Document Reviewed: 01/26/2008 ExitCare Patient Information 2013 ExitCare, LLC.   

## 2017-02-22 NOTE — Progress Notes (Signed)
26 y.o. G0P0000 Single  African American Fe here for annual exam. Periods normal no issues.No missed periods.  No partner change, but desires screening labs. Using coconut oil with good results for perineal dryness. No health issues today.   Patient's last menstrual period was 02/04/2017.          Sexually active: Yes.    The current method of family planning is condoms sometimes.    Exercising: Yes.    cardio & weights Smoker:  no  Health Maintenance: Pap:  05-08-16 neg, no endos MMG:  none Colonoscopy:  none BMD:   none TDaP:  Pt to check records Shingles: no Pneumonia: no Hep C and HIV: both neg 2017 Labs: none Self breast exam: not done   reports that she has never smoked. She has never used smokeless tobacco. She reports that she drinks alcohol. She reports that she does not use drugs.  Past Medical History:  Diagnosis Date  . Asthma   . GERD (gastroesophageal reflux disease)   . Seasonal allergies     Past Surgical History:  Procedure Laterality Date  . Unremarkable.    Marland Kitchen. UPPER GI ENDOSCOPY      Current Outpatient Prescriptions  Medication Sig Dispense Refill  . Multiple Vitamins-Minerals (MULTIVITAMIN PO) Take by mouth.    Marland Kitchen. omeprazole (PRILOSEC) 20 MG capsule Take 1 capsule (20 mg total) by mouth daily. 30 capsule 0  . Probiotic Product (PROBIOTIC PO) Take by mouth.    Marland Kitchen. UNABLE TO FIND Coconut oil pill     No current facility-administered medications for this visit.     Family History  Problem Relation Age of Onset  . Hypertension Mother     Living  . Arthritis Mother   . Asthma Mother   . Arthritis Maternal Grandmother   . Heart disease Maternal Grandfather     ROS:  Pertinent items are noted in HPI.  Otherwise, a comprehensive ROS was negative.  Exam:   LMP 02/04/2017    Ht Readings from Last 3 Encounters:  02/19/17 5\' 7"  (1.702 m)  11/28/16 5' 6.25" (1.683 m)  11/28/16 5\' 6"  (1.676 m)    General appearance: alert, cooperative and appears  stated age Head: Normocephalic, without obvious abnormality, atraumatic Neck: no adenopathy, supple, symmetrical, trachea midline and thyroid normal to inspection and palpation Lungs: clear to auscultation bilaterally Breasts: normal appearance, no masses or tenderness, No nipple retraction or dimpling, No nipple discharge or bleeding, No axillary or supraclavicular adenopathy Heart: regular rate and rhythm Abdomen: soft, non-tender; no masses,  no organomegaly Extremities: extremities normal, atraumatic, no cyanosis or edema Skin: Skin color, texture, turgor normal. No rashes or lesions Lymph nodes: Cervical, supraclavicular, and axillary nodes normal. No abnormal inguinal nodes palpated Neurologic: Grossly normal   Pelvic: External genitalia:  no lesions              Urethra:  normal appearing urethra with no masses, tenderness or lesions              Bartholin's and Skene's: normal                 Vagina: normal appearing vagina with normal color and discharge, no lesions              Cervix: no cervical motion tenderness, no lesions and nulliparous appearance              Pap taken: No. Bimanual Exam:  Uterus:  normal size, contour, position, consistency, mobility, non-tender  Adnexa: not evaluated               Rectovaginal: Confirms               Anus:  normal  appearance  Chaperone present: yes  A:  Well Woman with normal exam  Contraception condoms consistent  Screening labs  P:   Reviewed health and wellness pertinent to exam  Lab: HSV1,2, HIV,RPR, GC,Chlamydia, Affirm  Pap smear as above not taken   counseled on breast self exam, STD prevention, HIV risk factors and prevention, adequate intake of calcium and vitamin D, diet and exercise  return annually or prn  An After Visit Summary was printed and given to the patient.

## 2017-02-23 LAB — WET PREP BY MOLECULAR PROBE
CANDIDA SPECIES: NOT DETECTED
Gardnerella vaginalis: NOT DETECTED
TRICHOMONAS VAG: NOT DETECTED

## 2017-02-23 LAB — RPR

## 2017-02-23 LAB — GC/CHLAMYDIA PROBE AMP
CT Probe RNA: NOT DETECTED
GC PROBE AMP APTIMA: NOT DETECTED

## 2017-02-25 LAB — HSV(HERPES SIMPLEX VRS) I + II AB-IGG: HSV 1 Glycoprotein G Ab, IgG: <0.9 Index (ref <0.90)

## 2017-02-26 ENCOUNTER — Encounter: Payer: Self-pay | Admitting: Certified Nurse Midwife

## 2017-02-26 ENCOUNTER — Ambulatory Visit (INDEPENDENT_AMBULATORY_CARE_PROVIDER_SITE_OTHER): Payer: Managed Care, Other (non HMO) | Admitting: Certified Nurse Midwife

## 2017-02-26 VITALS — BP 108/72 | HR 64 | Resp 16 | Ht 66.25 in | Wt 169.0 lb

## 2017-02-26 DIAGNOSIS — N9089 Other specified noninflammatory disorders of vulva and perineum: Secondary | ICD-10-CM | POA: Diagnosis not present

## 2017-02-26 NOTE — Progress Notes (Signed)
26 y.o. Single African American female G0P0000 here with complaint of vulvar symptoms of burning on left side of vulva after oral sexual activity. No increase discharge. Describes discharge as white no odor. Still having stinging today, but not as uncomfortable today. Has applied coconut oil area one day only..Onset of symptoms 3 days ago. Denies new personal products. No STD concerns. Urinary symptoms none. Contraception is condoms. Recent lab screening with aex. No other concerns today. No partner change.  ROS Pertinent to above  O:Healthy female WDWN Affect: normal, orientation x 3  Exam:skin warm and dry Abdomen:soft, non tender  Inguinal Lymph nodes: no enlargement or tenderness Pelvic exam: External genital: normal female no lesions or blisters, on left from clitoral area down side of vulva crease small healing superficial lacerations were noted with slight redness at top of clitoral hood.Slightly tender to touch, no exudate or pus. Right area normal. BUS: negative Vagina: normal discharge noted at  Introitus only Declines pelvic exam    A:Normal external genital exam Superficial lacerations due to oral sexual activity. Recent STD screening labs all negative   P:Discussed findings of superficial lacerations related to oral stimulation and etiology. Discussed Aveeno sitz bath for comfort. Coconut Oil use for skin protection, and can also use prior to sexual activity. Discussed talking with partner about decrease in stimulation and making sure to not injure tissue. Questions addressed. Reviewed lab results with patient.  Rv prn

## 2017-02-26 NOTE — Patient Instructions (Signed)
Aveeno Bath for comfort and coconut oil for comfort and protection

## 2017-02-28 NOTE — Progress Notes (Signed)
Encounter reviewed Epifanio Labrador, MD   

## 2017-02-28 NOTE — Progress Notes (Signed)
Encounter reviewed Xyon Lukasik, MD   

## 2017-03-05 ENCOUNTER — Encounter: Payer: Self-pay | Admitting: Certified Nurse Midwife

## 2017-03-05 ENCOUNTER — Ambulatory Visit (INDEPENDENT_AMBULATORY_CARE_PROVIDER_SITE_OTHER): Payer: Managed Care, Other (non HMO) | Admitting: Certified Nurse Midwife

## 2017-03-05 VITALS — BP 120/80 | HR 70 | Resp 16 | Ht 66.25 in | Wt 170.0 lb

## 2017-03-05 DIAGNOSIS — N760 Acute vaginitis: Secondary | ICD-10-CM | POA: Diagnosis not present

## 2017-03-05 DIAGNOSIS — B9689 Other specified bacterial agents as the cause of diseases classified elsewhere: Secondary | ICD-10-CM | POA: Diagnosis not present

## 2017-03-05 DIAGNOSIS — R350 Frequency of micturition: Secondary | ICD-10-CM

## 2017-03-05 LAB — POCT URINALYSIS DIPSTICK
Bilirubin, UA: NEGATIVE
Blood, UA: NEGATIVE
Glucose, UA: NEGATIVE
Ketones, UA: NEGATIVE
LEUKOCYTES UA: NEGATIVE
NITRITE UA: NEGATIVE
PH UA: 5
PROTEIN UA: NEGATIVE
Urobilinogen, UA: NEGATIVE — AB

## 2017-03-05 MED ORDER — METRONIDAZOLE 500 MG PO TABS: 500.0000 mg | ORAL_TABLET | Freq: Two times a day (BID) | ORAL | 0 refills | Status: DC

## 2017-03-05 NOTE — Progress Notes (Signed)
26 y.o. Single African American female G0P0000 here with complaint of vaginal symptoms of itching and soreness after sexual activity. Reminds her of when she had BV ago. Onset of symptoms 2 days ago. Denies new personal products but used  coconut oil partner provided for lubrication. No STD concerns. Urinary symptoms burning when touches skin only. Period due in 2 days so noted some cramping today, no bleeding.. Contraception is condoms. Patient planning to not be sexually active, due to not really a relationship. No other health issues today.  ROS Pertinent to HPI  O:Healthy female WDWN Affect: normal, orientation x 3  Exam: Skin: warm and dry. Abdomen:soft, non  tender Lymph node: no enlargement or tenderness Pelvic exam: External genital: normal female BUS: negative Bladder and urethral meatus non tender Vagina: slightly watery discharge with odor  noted. Ph:   Wet prep taken, Ph 4.5 Cervix: normal, non tender, no CMT Uterus: normal, non tender Adnexa:normal, non tender, no masses or fullness noted   Wet Prep results: KOH,Saline + clue cells poct urine-neg  A:Normal pelvic exam BV Contraception condoms   P:Discussed findings of BV and etiology. Discussed  baking soda sitz bath for comfort. Avoid moist clothes or pads for extended period of time.  Coconut Oil (pure) use for skin protection prior to activity can be used to external skin for protection or dryness. Rx: Flagyl see order with instructions. Alcohol precautions given.  Rv prn

## 2017-03-10 ENCOUNTER — Encounter: Payer: Self-pay | Admitting: Certified Nurse Midwife

## 2017-03-11 ENCOUNTER — Telehealth: Payer: Self-pay | Admitting: Certified Nurse Midwife

## 2017-03-11 NOTE — Telephone Encounter (Signed)
Patient is experiencing some tingling sensation and wondering if it can be from the flagyl.

## 2017-03-11 NOTE — Telephone Encounter (Signed)
My advise is to stop the Flagyl.  Then wait 48 hours - if no further symptoms then change to Metrogel.  If she again has symptoms to call back.

## 2017-03-11 NOTE — Telephone Encounter (Signed)
Spoke with patient. Advised of message as seen below from [name], FNP. Patient is agreeable and verbalizes understanding. Will contact the office on Wednesday to provide an update on how she is feeling so that adjustments can be made to her medication if she is feeling well. Will contact the office earlier if symptoms worsen.  Routing to covering provider for final review. Patient agreeable to disposition. Will close encounter.

## 2017-03-11 NOTE — Telephone Encounter (Signed)
Visit Follow-Up Question  Message [phone]  From Traci SermonChloe B Papesh To Verner CholDeborah S Leonard, CNM Sent 03/10/2017 8:50 PM  Hi Mrs. Darcel BayleyLeonard,   I started my period on the 16th and the following day the Flagyl. Today I noticed my back was tingling more than usual and it was bothersome. Overall I felt odd on the second day (shoulders, upper arms & upper back tingling, & dizziness) and I don't know if I just didn't eat enough when I took it but I didn't fee sick. I've been on it multiple times before where I had slight tingling or to the point so minor it was ignored. I didn't take the second dosage for today because I was scared it would occur again and didn't like the way I felt.  I was wondering if I could stop the meds completely or find another alternative that is not so harsh?   Responsible Party   Pool - [facility] No one has taken responsibility for this message.  No actions have been taken on this message.    Spoke with patient regarding mychart message and telephone call. Patient states that she started taking Flagyl on the 17th for BV. On 3/18 she began to have tingling in her upper back, shoulder, and upper arms. Also was having intermittent dizziness. Patient stopped taking Flagyl after her morning dose on 3/18. Reports her symptoms have decreased, but are still intermittent. Denies any GI symptoms, change in vision with dizziness, or headache. Patient reports she has taken Flagyl before with slight tingling sensation in upper back, but symptoms are worse with this round of medication. Advised will speak with a covering physician and return call with further recommendations. Advised not to take any more Flagyl at this time. Patient is agreeable.

## 2017-03-11 NOTE — Telephone Encounter (Signed)
Telephone encounter created to review with covering provider.

## 2017-03-12 NOTE — Progress Notes (Signed)
Encounter reviewed Jill Jertson, MD   

## 2017-03-13 ENCOUNTER — Emergency Department (HOSPITAL_BASED_OUTPATIENT_CLINIC_OR_DEPARTMENT_OTHER)
Admission: EM | Admit: 2017-03-13 | Discharge: 2017-03-13 | Disposition: A | Discharge status: Home / Self Care | Payer: Managed Care, Other (non HMO) | Attending: Dermatology | Admitting: Dermatology

## 2017-03-13 ENCOUNTER — Telehealth: Payer: Self-pay | Admitting: Certified Nurse Midwife

## 2017-03-13 ENCOUNTER — Encounter (HOSPITAL_BASED_OUTPATIENT_CLINIC_OR_DEPARTMENT_OTHER): Payer: Self-pay

## 2017-03-13 DIAGNOSIS — R202 Paresthesia of skin: Secondary | ICD-10-CM

## 2017-03-13 DIAGNOSIS — Z5321 Procedure and treatment not carried out due to patient leaving prior to being seen by health care provider: Secondary | ICD-10-CM | POA: Diagnosis not present

## 2017-03-13 DIAGNOSIS — J45909 Unspecified asthma, uncomplicated: Secondary | ICD-10-CM | POA: Insufficient documentation

## 2017-03-13 NOTE — ED Notes (Signed)
Called to treatment room x 2 with no answer from lobby 

## 2017-03-13 NOTE — ED Notes (Signed)
Called to treatment room with no answer from lobby 

## 2017-03-13 NOTE — Telephone Encounter (Signed)
Referral placed to [facility] for patient to see Dr.Blythe. Patient has been notified. Patient would like to call to schedule this appointment. Phone number provided to patient [phone]. Patient will return call to the office to let Leota Sauerseborah Leonard CNM know of appointment date and time or if she needs assistance.

## 2017-03-13 NOTE — ED Triage Notes (Signed)
c/o "tingling all over" since 3/15 after starting flagyl-NAD-steady gait

## 2017-03-13 NOTE — Telephone Encounter (Signed)
Spoke with patient. Patient states that since stopping Flagyl on 03/10/2017 she is feeling much better, but is still having symptoms of intermittent tingling in the lower mid section of her back that radiates to her shoulders and arms (see telephone encounter dated 03/11/2017). Denies any other symptoms. Patient reports she is no longer having symptoms of BV after having her menses. Patient does not have a PCP. Asking if she needs to start alternative medication for BV. Advised with symptoms she is having and BV symptoms resolved do not feel she needs new medication at this time. Advised if BV symptoms return will need to contact the office. Advised will review with provider regarding intermittent tingling and recommendations regarding follow up.  Leota Sauerseborah Leonard CNM should this patient have referral to PCP or neurology?

## 2017-03-13 NOTE — Telephone Encounter (Signed)
I think PCP is a good choice at this time. See if she can be scheduled with Joaquin CourtsStacey Blythe, Kriste BasqueHannah Kim.

## 2017-03-13 NOTE — Telephone Encounter (Signed)
Left message to call Day Deery at [phone]. 

## 2017-03-13 NOTE — Telephone Encounter (Signed)
[facility] ongoing conversation. Patient said this about a reaction that she is having.

## 2017-03-14 NOTE — Telephone Encounter (Signed)
Ok to close

## 2017-03-14 NOTE — Telephone Encounter (Signed)
Patient is scheduled for 04/18/2017 at 3:15 pm with Abbe AmsterdamJessica Copland, [facility]  Leota Sauerseborah Leonard CNM okay to close encounter?

## 2017-03-20 ENCOUNTER — Telehealth: Payer: Self-pay | Admitting: Certified Nurse Midwife

## 2017-03-20 NOTE — Telephone Encounter (Signed)
Patient has some questions for the nurse. No information given. °

## 2017-03-20 NOTE — Telephone Encounter (Signed)
Spoke with patient. Patient states she is experiencing slight vaginal discomfort, vaginal burning, itching and milky vaginal discharge. Patient reports odor as chemical smell. Patient states she was is for OV on 3/21 and was prescribed oral Flagyl for a little overgrowth, but had to stop d/t side effects. LMP 3/16. Patient denies urinary complains, back pain, fever or nausea. Patient states she has not been sexually active and has taken oatmeal baths with no changes. Recommended OV for further evaluation, patient scheduled for 03/21/17 at 8:45 am with Leota Sauerseborah Leonard, CNM. Advised patient would review with Leota Sauerseborah Leonard, CNM and return call with any additional recommendations, patient is agreeable.  Routing to provider for final review. Patient is agreeable to disposition. Will close encounter.

## 2017-03-21 ENCOUNTER — Ambulatory Visit (INDEPENDENT_AMBULATORY_CARE_PROVIDER_SITE_OTHER): Payer: Managed Care, Other (non HMO) | Admitting: Certified Nurse Midwife

## 2017-03-21 ENCOUNTER — Encounter: Payer: Self-pay | Admitting: Certified Nurse Midwife

## 2017-03-21 VITALS — BP 100/60 | HR 68 | Resp 16 | Wt 166.0 lb

## 2017-03-21 DIAGNOSIS — N898 Other specified noninflammatory disorders of vagina: Secondary | ICD-10-CM | POA: Diagnosis not present

## 2017-03-21 NOTE — Progress Notes (Signed)
26 y.o. Single African American female G0P0000 here with complaint of vaginal symptoms of itching, burning, and increase discharge yesterday after she feels she ovulated. Trying to keep a diary of vaginal symptoms, so she will know what is normal.External itching has subsided now and vaginal odor no longer present. Still slight vaginal burning. Not sexually active and no STD screening needed. Describes discharge as creamy; sometimes watery. Per patient Flagyl caused tingling when she was given it for BV. Onset of symptoms 2 days ago. Denies new personal products or vaginal dryness. Urinary symptoms none . Contraception is condoms sometimes. Periods normal, no issues. No other health issues today.  ROS  Pertinent to HPI  O:Healthy female WDWN Affect: normal, orientation x 3  Exam: Skin; warm and dry Abdomen: soft, non tender  Inguinal Lymph nodes: no enlargement or tenderness Pelvic exam: External genital: normal female, no lesions, redness or tenderness to touch. Area of concern on left vulva, no longer itching and no change in skin texture noted BUS: negative Vagina: normal appearing non odorous discharge noted.   Affirm taken patient request Cervix: normal, non tender, no CMT Uterus: normal, non tender Adnexa:normal, non tender, no masses or fullness noted    A:Normal pelvic exam Normal appearing female genital area, no area of concern visualized ? Normal physiological change with ovulation R/O vaginal infection   P:Discussed findings of normal pelvic and genital appearance. Area of concern shows no visible changes. Discussed Aveeno sitz bath for comfort.  Discussed normal changes with discharge with ovulation and may have some itching, even though not common. Patient relieved no change in appearance. Lab: Affirm Discussed normal physiological discharge appearance and role of healthy vagina. Questions addressed.   Rv prn

## 2017-03-22 LAB — WET PREP BY MOLECULAR PROBE
Candida species: NOT DETECTED
GARDNERELLA VAGINALIS: NOT DETECTED
TRICHOMONAS VAG: NOT DETECTED

## 2017-03-22 NOTE — Progress Notes (Signed)
Encounter reviewed Jill Jertson, MD   

## 2017-04-15 DIAGNOSIS — K219 Gastro-esophageal reflux disease without esophagitis: Secondary | ICD-10-CM | POA: Insufficient documentation

## 2017-04-18 ENCOUNTER — Ambulatory Visit: Payer: BLUE CROSS/BLUE SHIELD | Admitting: Family Medicine

## 2017-05-22 ENCOUNTER — Ambulatory Visit (INDEPENDENT_AMBULATORY_CARE_PROVIDER_SITE_OTHER): Payer: Managed Care, Other (non HMO) | Admitting: Certified Nurse Midwife

## 2017-05-22 ENCOUNTER — Encounter: Payer: Self-pay | Admitting: Certified Nurse Midwife

## 2017-05-22 VITALS — BP 108/68 | HR 68 | Resp 16 | Ht 66.25 in | Wt 166.0 lb

## 2017-05-22 DIAGNOSIS — N898 Other specified noninflammatory disorders of vagina: Secondary | ICD-10-CM

## 2017-05-22 NOTE — Progress Notes (Signed)
26 y.o. Single African American female G0P0000 here with complaint of vaginal symptoms of , burning, and increase discharge. Describes discharge as egg white in appearance, no odor..Onset of symptoms 1-2 days ago. Denies new personal products or vaginal dryness. Uses condoms off and on. Recently treated for BV and yeast on 04/24/17 with Diflucan, Metrogel, completed all medication. Mid cycle with occasional cramping noted. No STD concerns. Urinary symptoms none . Contraception is condoms. No other health concerns today.  ROS Pertinent to HPI  O:Healthy female WDWN Affect: normal, orientation x 3  Exam:Skin warm and dry Abdomen: soft, non tender Lymph node: no enlargement or tenderness Pelvic exam: External genital: normal female, no lesions or scaling or exudate BUS: negative Vagina: watery non odorous discharge noted with some egg white appearance and stringy mucous. Affirm taken Cervix: normal, non tender, no CMT Uterus: normal, non tender Adnexa:normal, non tender, no masses or fullness noted  A:Normal pelvic exam R/O vaginal infection Contraception condoms   P:Discussed findings of normal pelvic exam and discharge has ovulatory mucous appearance . Etiology discussed.Discussed Aveeno or baking soda sitz bath for comfort. Questions addressed. Lab Affirm will treat if indicated.  Rv prn

## 2017-05-23 LAB — WET PREP BY MOLECULAR PROBE
Candida species: NOT DETECTED
GARDNERELLA VAGINALIS: NOT DETECTED
Trichomonas vaginosis: NOT DETECTED

## 2017-07-11 ENCOUNTER — Telehealth: Payer: Self-pay | Admitting: Certified Nurse Midwife

## 2017-07-11 NOTE — Telephone Encounter (Signed)
Patient called state she normally starts her period on the 15th of every month.  States she has the breast tenderness, cramping and hot flashes but is 5 days late.  Would like to speak to a nurse.

## 2017-07-11 NOTE — Telephone Encounter (Signed)
Spoke with patient. Reports LMP 06/07/17, history of regular cycles, concerned cycle has not started. Reports breast tenderness, hot flashes and intermittent "menstrual like" cramps. SA, uses condoms. States symptoms began prior to intercourse. Denies current vaginal discharge, odor or urinary complaints. Has not taken UPT. Recommended OV for further evaluation, patient declined. Patient states she will take UPT and call with results. Advised patient if UPT negative and menses does not start, OV still recommended for further evaluation. Advised patient [name], CNM will review, will return call with any additional recommendations. Patient verbalizes understanding and is agreeable.   Routing to provider for final review. Patient is agreeable to disposition. Will close encounter.

## 2017-07-12 ENCOUNTER — Telehealth: Payer: Self-pay | Admitting: *Deleted

## 2017-07-12 NOTE — Telephone Encounter (Signed)
agree

## 2017-07-12 NOTE — Telephone Encounter (Signed)
Patient calling with update from telephone encounter dated 07/11/17. Reports UPT negative, menses has not started. States "feels like its trying to start". Recommended OV for further evaluation, patient declined at this time. Patient states she will continue to monitor cycle and return call for OV if no menses.   Advised patient would update Leota Sauerseborah Leonard, CNM Will return call with any additional recommendations, patient is agreeable.

## 2017-07-16 ENCOUNTER — Telehealth: Payer: Self-pay | Admitting: Certified Nurse Midwife

## 2017-07-16 NOTE — Telephone Encounter (Signed)
Patient has an ingrown hair on the lips of her vagina. She wants to come in tomorrow anytime with DL

## 2017-07-16 NOTE — Telephone Encounter (Signed)
Left message for patient that appointment has been scheduled for tomorrow at 8:30 am with Leota Sauerseborah Leonard CNM. Advised to return call to confirm she is able to make this appointment.

## 2017-07-16 NOTE — Telephone Encounter (Signed)
Spoke with patient who is agreeable to be seen tomorrow at 8:30 am with Leota Sauerseborah Leonard CNM. Declines earlier appointments.  Routing to provider for final review. Patient agreeable to disposition. Will close encounter.

## 2017-07-17 ENCOUNTER — Ambulatory Visit (INDEPENDENT_AMBULATORY_CARE_PROVIDER_SITE_OTHER): Payer: Managed Care, Other (non HMO) | Admitting: Certified Nurse Midwife

## 2017-07-17 ENCOUNTER — Encounter: Payer: Self-pay | Admitting: Certified Nurse Midwife

## 2017-07-17 VITALS — BP 120/76 | HR 64 | Resp 14 | Ht 66.25 in | Wt 168.0 lb

## 2017-07-17 DIAGNOSIS — L731 Pseudofolliculitis barbae: Secondary | ICD-10-CM | POA: Diagnosis not present

## 2017-07-17 NOTE — Patient Instructions (Signed)
Ingrown Hair An ingrown hair is a hair that curls and re-enters the skin instead of growing straight out of the skin. An ingrown hair can develop in any part of the skin that hair is removed from. An ingrown hair may cause small pockets of infection. What are the causes? An ingrown hair can be caused by:  Shaving.  Tweezing.  Waxing.  Using a hair removal cream.  What increases the risk? Ingrown hairs are more likely to develop in people who have curly hair. What are the signs or symptoms? Symptoms of an ingrown hair may include:  Small bumps on the skin. The bumps may be filled with pus.  Pain.  Itching.  How is this diagnosed? An ingrown hair is diagnosed with a skin exam. How is this treated? Treatment is often not needed unless the ingrown hair has caused an infection. Treatment may involve:  Applying prescription creams to the skin. This can help with inflammation.  Applying warm compresses to the skin. This can help soften the skin.  Taking antibiotic medicine. An antibiotic may be prescribed if the infection is severe.  Follow these instructions at home:  Do not shave irritated areas of skin. You may start shaving these areas again once the irritation has gone away.  Take, apply, or use over-the-counter and prescription medicines only as told by your health care provider. This includes any prescription creams.  If you were prescribed an antibiotic medicine, take it as told by your health care provider. Do not stop taking the antibiotic even if your condition improves.  To help remove ingrown hairs on your face, you may use a facial sponge in a gentle circular motion.  If directed, apply heat to the affected area. Use the heat source that your health care provider recommends, such as a moist heat pack or a heating pad. ? Place a towel between your skin and the heat source. ? Leave the heat on for 20-30 minutes. ? Remove the heat if your skin turns bright red.  This is especially important if you are unable to feel pain, heat, or cold. You may have a greater risk of getting burned. How is this prevented?  Shower before shaving.  Wrap areas that you are going to shave in warm, moist wraps for several minutes before shaving. The warmth and moisture helps to soften the hairs and makes ingrown hairs less likely.  Use thick shaving gels.  Use a razor that cuts hair slightly above your skin. Or, use an electric shaver with a long shave setting.  Shave in the direction of hair growth.  Avoid making multiple razor strokes.  Apply a moisturizing lotion after shaving. This information is not intended to replace advice given to you by your health care provider. Make sure you discuss any questions you have with your health care provider. Document Released: 03/18/2001 Document Revised: 06/29/2016 Document Reviewed: 09/30/2015 Elsevier Interactive Patient Education  2018 Elsevier Inc.  

## 2017-07-17 NOTE — Progress Notes (Signed)
Patient ID: Traci SermonChloe B Bush, female   DOB: 02/14/1991, 26 y.o.   MRN: [id number]  26 y.o. Single African American female G0P0000 here with complaint of vaginal bump for the past 2-3 days. Has been doing laser hair removal, last one 2 weeks ago. Was given Bactracin/zinc ointment to use if she developed problems. Has applied for the past 24 hours and area feels better. No other new personal products. Describes discharge as normal no STD concerns. Urinary symptoms none . Contraception is Abstinence.  ROS Pertinent to HPI  O:Healthy female WDWN Affect: normal, orientation x 3  Exam:Skin warm and dry Abdomen: soft, non tender  Inguinal Lymph nodes no enlargement or tenderness  External genital: normal female, small papule noted on right vulva with ingrown hair noted with magnifying glass, not inflamed, not red,  Non tender BUS: negative Vagina: normal discharge noted.  Cervix: normal, non tender, no CMT Uterus: normal, non tender Adnexa:normal, non tender, no masses or fullness noted    A:Normal pelvic exam Ingrown hair from laser removal, not infected or inflamed   P:Discussed findings of ingrown  hair and etiology. Discussed Epsom salt soak to resolve and use ointment given by Laser facility to resolve. Warning signs given with ingrown hair.  Rv prn

## 2017-07-18 ENCOUNTER — Ambulatory Visit: Payer: Managed Care, Other (non HMO) | Admitting: Certified Nurse Midwife

## 2017-08-15 ENCOUNTER — Telehealth: Payer: Self-pay | Admitting: Certified Nurse Midwife

## 2017-08-15 NOTE — Telephone Encounter (Signed)
Patient is having abnormal cycle and wants to discuss this with the nurse.

## 2017-08-15 NOTE — Telephone Encounter (Signed)
Patient returning call.

## 2017-08-15 NOTE — Telephone Encounter (Signed)
Spoke with patient. Patient states that her cycle in July was 5 days late. Took UPT which was negative. Menses was due to start 08/12/2017 and has not. Patient is having increased cramping. Has not had intercourse since before her menses in July. Reports her menses has always been regular each month and is concerned about changes. Advised will need to be seen in the office for further evaluation. Patient is agreeable. Appointment scheduled for 08/20/2017 at 9:30 am with Anita Bush CNM. Patient is agreeable to date and time.  Routing to provider for final review. Patient agreeable to disposition. Will close encounter.

## 2017-08-15 NOTE — Telephone Encounter (Signed)
Left message to call Nevayah Faust at [phone]. 

## 2017-08-19 ENCOUNTER — Telehealth: Payer: Self-pay | Admitting: Certified Nurse Midwife

## 2017-08-19 NOTE — Telephone Encounter (Signed)
Patient cancelled her appointment for tomorrow because her period came on. She was coming in for irregular menses but no longer need appointment.

## 2017-08-20 ENCOUNTER — Ambulatory Visit: Payer: Managed Care, Other (non HMO) | Admitting: Certified Nurse Midwife

## 2017-08-28 NOTE — Telephone Encounter (Signed)
Is it ok to close encounter?

## 2017-08-28 NOTE — Telephone Encounter (Signed)
Yes ok to close

## 2017-09-10 ENCOUNTER — Encounter: Payer: Self-pay | Admitting: Certified Nurse Midwife

## 2017-09-10 ENCOUNTER — Ambulatory Visit (INDEPENDENT_AMBULATORY_CARE_PROVIDER_SITE_OTHER): Payer: Managed Care, Other (non HMO) | Admitting: Certified Nurse Midwife

## 2017-09-10 VITALS — BP 100/60 | HR 64 | Temp 98.4°F | Resp 16 | Ht 66.25 in | Wt 168.0 lb

## 2017-09-10 DIAGNOSIS — Z113 Encounter for screening for infections with a predominantly sexual mode of transmission: Secondary | ICD-10-CM

## 2017-09-10 DIAGNOSIS — N926 Irregular menstruation, unspecified: Secondary | ICD-10-CM

## 2017-09-10 LAB — POCT URINE PREGNANCY: PREG TEST UR: NEGATIVE

## 2017-09-10 NOTE — Progress Notes (Signed)
26 y.o. Single African American female G0P0000 here with complaint of vaginal symptoms of some burning sensation after sexual activity. and increase discharge. Describes discharge as white no odor now.. Onset of symptoms 6 days ago. Denies new personal products or vaginal dryness. Last sexual activity was 09/04/17 and noted the next day she had some vaginal odor, used Refresh with the odor resolved. Here to check to see if still present. No  STD concerns. Urinary symptoms none . Contraception condoms. Cycles are changing not always on the same day, but 32 day now. No other issues today.  Review of Systems  Constitutional: Negative.   Gastrointestinal: Negative.   Genitourinary: Negative.   Psychiatric/Behavioral: Negative.     O:Healthy female WDWN Affect: normal, orientation x 3  Exam: Skin warm and dry Abdomen:soft, non tender no masses  inguinal Lymph nodes: no enlargement or tenderness Pelvic exam: External genital: normal female BUS: negative Vagina: white non odorous discharge noted Affirm taken Cervix: normal, non tender, no CMT Uterus: normal, non tender Adnexa:normal, non tender, no masses or fullness noted      A:Normal pelvic exam R/o vaginal infection STD screening   P:Discussed findings of normal pelvic exam and etiology. Discussed importance of consistent condom use for STD protection and contraception. Patient aware and just forgot. Questions answered regarding ovulation timing for contraception also. Labs: affirm, GC,Chlamydia, HIV, RPR   Rv prn

## 2017-09-11 LAB — RPR: RPR: NONREACTIVE

## 2017-09-11 LAB — VAGINITIS/VAGINOSIS, DNA PROBE
Candida Species: NEGATIVE
Gardnerella vaginalis: NEGATIVE
TRICHOMONAS VAG: NEGATIVE

## 2017-09-11 LAB — GC/CHLAMYDIA PROBE AMP
Chlamydia trachomatis, NAA: NEGATIVE
NEISSERIA GONORRHOEAE BY PCR: NEGATIVE

## 2017-09-11 LAB — HIV ANTIBODY (ROUTINE TESTING W REFLEX): HIV SCREEN 4TH GENERATION: NONREACTIVE

## 2017-09-26 ENCOUNTER — Telehealth: Payer: Self-pay

## 2017-09-26 ENCOUNTER — Telehealth: Payer: Self-pay | Admitting: Certified Nurse Midwife

## 2017-09-26 NOTE — Telephone Encounter (Signed)
Agree with plan. Encourage to increase water intake. If pain increases in frequency or intensity, needs to be seen in ER.

## 2017-09-26 NOTE — Telephone Encounter (Signed)
Patient is coming in for pregnancy confirmation tomorrow and states she is having some bleeding today.

## 2017-09-26 NOTE — Telephone Encounter (Signed)
Spoke with patient at time of incoming call. Patient states that she has started having increased bleeding like a normal menses. Please see telephone encounter dated earlier today. Advised will need to be seen for immediate evaluation with ER locally due to recent positive UPT, sharp pain, and increased bleeding. Patient is agreeable and will seek evaluation at local ER.

## 2017-09-26 NOTE — Telephone Encounter (Signed)
Spoke with patient. Patient states she is scheduled to be seen tomorrow for pregnancy confirmation with [name] CNM. Woke up this morning and had light spotting that was dark brown/red when she went to the restroom. Having mild cramping. Did have 1 episode of sharp pain. Reports she was seen last week at urgent care and is being treated for BV with Flagyl po BID x 7 days. Today is last day of medication. Denies any history of STD. Advised patient she will need to be seen for evaluation. Explained risks for ectopic. Patient works in [place] and cannot be seen today. Advised will need to keep appointment as scheduled for tomorrow. If bleeding continues, gets heavier, or she develops sharp pain again will need to be seen immediately with ER. Patient verbalizes understanding.  Routing to [employer] CNM for review.

## 2017-09-26 NOTE — Telephone Encounter (Signed)
Spoke with patient. Advised of message as seen below from [name]. Patient verbalizes understanding. Encounter closed. 

## 2017-09-27 ENCOUNTER — Ambulatory Visit: Payer: Managed Care, Other (non HMO) | Admitting: Certified Nurse Midwife

## 2017-10-04 ENCOUNTER — Ambulatory Visit (INDEPENDENT_AMBULATORY_CARE_PROVIDER_SITE_OTHER): Payer: Managed Care, Other (non HMO) | Admitting: Certified Nurse Midwife

## 2017-10-04 ENCOUNTER — Encounter: Payer: Self-pay | Admitting: Certified Nurse Midwife

## 2017-10-04 VITALS — BP 104/64 | HR 70 | Resp 16 | Ht 66.25 in | Wt 165.0 lb

## 2017-10-04 DIAGNOSIS — Z8759 Personal history of other complications of pregnancy, childbirth and the puerperium: Secondary | ICD-10-CM | POA: Diagnosis not present

## 2017-10-04 DIAGNOSIS — N898 Other specified noninflammatory disorders of vagina: Secondary | ICD-10-CM | POA: Diagnosis not present

## 2017-10-04 DIAGNOSIS — Z3009 Encounter for other general counseling and advice on contraception: Secondary | ICD-10-CM

## 2017-10-04 NOTE — Progress Notes (Signed)
Subjective:     Patient ID: Anita Bush, female   DOB: [birth year], 26 y.o.   MRN: [id number]  Here with complaint of vaginal irritation for past 3-4 days. History of + UPT at home and repeated at Urgent Care 09/20/17 and was positive and treated for BV with Flagyl use. Completed medication. Starting having vaginal bleeding and was seen at [facility] ER 09/26/17 with HCG of 16 and PUS with no IUP noted. Continued to have period like bleeding and was seen 2 days later and  and repeat HCG  was <5. Was told pregnancy not present now. Bleeding progressed until 10/01/17 and stopped. Unplanned pregnancy, emotionally OK. Considering contraception options.No contraception previously used, except occasional condom and short term OCP use. No cramping today, just feels like she vaginal irritation. No breast tenderness or fatigue.     Review of Systems  Constitutional: Negative.   Respiratory: Negative.   Cardiovascular: Negative.   Gastrointestinal: Negative.   Genitourinary: Negative.   Neurological: Negative.   Psychiatric/Behavioral: Negative.        Objective:   Physical Exam  Constitutional: She is oriented to person, place, and time. She appears well-developed and well-nourished.  Abdominal: She exhibits no mass. There is no tenderness. There is no rebound and no guarding.  Genitourinary: Vagina normal. There is no rash, tenderness or lesion on the right labia. There is no rash, tenderness or lesion on the left labia. Uterus is not enlarged and not tender. Cervix exhibits no motion tenderness, no discharge and no friability. Right adnexum displays no mass, no tenderness and no fullness. Left adnexum displays no mass, no tenderness and no fullness. No bleeding in the vagina.  Genitourinary Comments: Scant light pink/brown discharge seen from cervix only, no odor and scant vaginal discharge. Affirm taken  Lymphadenopathy:       Right: No inguinal adenopathy present.       Left: No inguinal adenopathy  present.  Neurological: She is alert and oriented to person, place, and time.  Skin: Skin is warm and dry.  Psychiatric: She has a normal mood and affect. Her behavior is normal. Judgment and thought content normal.       Assessment:     Normal pelvic exam Recent SAB with evaluation and follow up at [facility] ER R/O vaginal infection Contraceptive options desired    Plan:     Discussed finding of normal pelvic exam and scant vaginal discharge finding. Discussed with review of records from [facility] SAB confirmed there as she described. Discussed may not start next period at regular time, but also may not change. Discussed importance of consistent condoms if sexually active and does not want another pregnancy. Discussed no obvious irritation noted in vagina or external tissue. Comfort measure of baking soda sitz bath discussed. Will treat per affirm results if indicated. Lab: affirm Discussed options of IUD, Nexplanon, Depo Provera, Nuvaring, OCP risks/benefits/insertion and removal and bleeding expectations. May be interested in Nuvaring. Given information and will call if decides to start with next period. Questions addressed.  Rv prn

## 2017-10-04 NOTE — Patient Instructions (Signed)
Ethinyl Estradiol; Etonogestrel vaginal ring What is this medicine? ETHINYL ESTRADIOL; ETONOGESTREL (ETH in il es tra DYE ole; et oh noe JES trel) vaginal ring is a flexible, vaginal ring used as a contraceptive (birth control method). This medicine combines two types of female hormones, an estrogen and a progestin. This ring is used to prevent ovulation and pregnancy. Each ring is effective for one month. This medicine may be used for other purposes; ask your health care provider or pharmacist if you have questions. COMMON BRAND NAME(S): NuvaRing What should I tell my health care provider before I take this medicine? They need to know if you have or ever had any of these conditions: -abnormal vaginal bleeding -blood vessel disease or blood clots -breast, cervical, endometrial, ovarian, liver, or uterine cancer -diabetes -gallbladder disease -heart disease or recent heart attack -high blood pressure -high cholesterol -kidney disease -liver disease -migraine headaches -stroke -systemic lupus erythematosus (SLE) -tobacco smoker -an unusual or allergic reaction to estrogens, progestins, other medicines, foods, dyes, or preservatives -pregnant or trying to get pregnant -breast-feeding How should I use this medicine? Insert the ring into your vagina as directed. Follow the directions on the prescription label. The ring will remain place for 3 weeks and is then removed for a 1-week break. A new ring is inserted 1 week after the last ring was removed, on the same day of the week. Check often to make sure the ring is still in place, especially before and after sexual intercourse. If the ring was out of the vagina for an unknown amount of time, you may not be protected from pregnancy. Perform a pregnancy test and call your doctor. Do not use more often than directed. A patient package insert for the product will be given with each prescription and refill. Read this sheet carefully each time. The  sheet may change frequently. Contact your pediatrician regarding the use of this medicine in children. Special care may be needed. This medicine has been used in female children who have started having menstrual periods. Overdosage: If you think you have taken too much of this medicine contact a poison control center or emergency room at once. NOTE: This medicine is only for you. Do not share this medicine with others. What if I miss a dose? You will need to replace your vaginal ring once a month as directed. If the ring should slip out, or if you leave it in longer or shorter than you should, contact your health care professional for advice. What may interact with this medicine? Do not take this medicine with the following medication: -dasabuvir; ombitasvir; paritaprevir; ritonavir -ombitasvir; paritaprevir; ritonavir This medicine may also interact with the following medications: -acetaminophen -antibiotics or medicines for infections, especially rifampin, rifabutin, rifapentine, and griseofulvin, and possibly penicillins or tetracyclines -aprepitant -ascorbic acid (vitamin C) -atorvastatin -barbiturate medicines, such as phenobarbital -bosentan -carbamazepine -caffeine -clofibrate -cyclosporine -dantrolene -doxercalciferol -felbamate -grapefruit juice -hydrocortisone -medicines for anxiety or sleeping problems, such as diazepam or temazepam -medicines for diabetes, including pioglitazone -modafinil -mycophenolate -nefazodone -oxcarbazepine -phenytoin -prednisolone -ritonavir or other medicines for HIV infection or AIDS -rosuvastatin -selegiline -soy isoflavones supplements -St. John's wort -tamoxifen or raloxifene -theophylline -thyroid hormones -topiramate -warfarin This list may not describe all possible interactions. Give your health care provider a list of all the medicines, herbs, non-prescription drugs, or dietary supplements you use. Also tell them if you smoke,  drink alcohol, or use illegal drugs. Some items may interact with your medicine. What should I watch for while using   this medicine? Visit your doctor or health care professional for regular checks on your progress. You will need a regular breast and pelvic exam and Pap smear while on this medicine. Use an additional method of contraception during the first cycle that you use this ring. Do not use a diaphragm or female condom, as the ring can interfere with these birth control methods and their proper placement. If you have any reason to think you are pregnant, stop using this medicine right away and contact your doctor or health care professional. If you are using this medicine for hormone related problems, it may take several cycles of use to see improvement in your condition. Smoking increases the risk of getting a blood clot or having a stroke while you are using hormonal birth control, especially if you are more than 26 years old. You are strongly advised not to smoke. This medicine can make your body retain fluid, making your fingers, hands, or ankles swell. Your blood pressure can go up. Contact your doctor or health care professional if you feel you are retaining fluid. This medicine can make you more sensitive to the sun. Keep out of the sun. If you cannot avoid being in the sun, wear protective clothing and use sunscreen. Do not use sun lamps or tanning beds/booths. If you wear contact lenses and notice visual changes, or if the lenses begin to feel uncomfortable, consult your eye care specialist. In some women, tenderness, swelling, or minor bleeding of the gums may occur. Notify your dentist if this happens. Brushing and flossing your teeth regularly may help limit this. See your dentist regularly and inform your dentist of the medicines you are taking. If you are going to have elective surgery, you may need to stop using this medicine before the surgery. Consult your health care professional  for advice. This medicine does not protect you against HIV infection (AIDS) or any other sexually transmitted diseases. What side effects may I notice from receiving this medicine? Side effects that you should report to your doctor or health care professional as soon as possible: -breast tissue changes or discharge -changes in vaginal bleeding during your period or between your periods -chest pain -coughing up blood -dizziness or fainting spells -headaches or migraines -leg, arm or groin pain -severe or sudden headaches -stomach pain (severe) -sudden shortness of breath -sudden loss of coordination, especially on one side of the body -speech problems -symptoms of vaginal infection like itching, irritation or unusual discharge -tenderness in the upper abdomen -vomiting -weakness or numbness in the arms or legs, especially on one side of the body -yellowing of the eyes or skin Side effects that usually do not require medical attention (report to your doctor or health care professional if they continue or are bothersome): -breakthrough bleeding and spotting that continues beyond the 3 initial cycles of pills -breast tenderness -mood changes, anxiety, depression, frustration, anger, or emotional outbursts -increased sensitivity to sun or ultraviolet light -nausea -skin rash, acne, or brown spots on the skin -weight gain (slight) This list may not describe all possible side effects. Call your doctor for medical advice about side effects. You may report side effects to FDA at 1-800-FDA-1088. Where should I keep my medicine? Keep out of the reach of children. Store at room temperature between 15 and 30 degrees C (59 and 86 degrees F) for up to 4 months. The product will expire after 4 months. Protect from light. Throw away any unused medicine after the expiration date. NOTE: This   sheet is a summary. It may not cover all possible information. If you have questions about this medicine, talk  to your doctor, pharmacist, or health care provider.  2018 Elsevier/Gold Standard (2016-08-17 17:00:31)  

## 2017-10-05 LAB — VAGINITIS/VAGINOSIS, DNA PROBE
Candida Species: NEGATIVE
Gardnerella vaginalis: NEGATIVE
Trichomonas vaginosis: NEGATIVE

## 2017-10-08 NOTE — Telephone Encounter (Signed)
Patient was seen by [facility] ER on 09/26/2017 and seen for follow up on 09/30/2017. Was seen in our office as well on 10/04/2017.  Routing to provider for final review. Patient agreeable to disposition. Will close encounter.

## 2017-11-29 ENCOUNTER — Encounter: Payer: Self-pay | Admitting: Certified Nurse Midwife

## 2017-12-11 ENCOUNTER — Telehealth: Payer: Self-pay | Admitting: Certified Nurse Midwife

## 2017-12-11 ENCOUNTER — Encounter: Payer: Self-pay | Admitting: Certified Nurse Midwife

## 2017-12-11 NOTE — Telephone Encounter (Signed)
Spoke with patient. Patient states that she went to the restroom earlier today and has started having light bleeding. Feels this is cycle related. Has not experienced any pain only mild cramping. Patient would like to monitor. If patient has any concerns or new symptoms will schedule an OV.  Routing to provider for final review. Patient agreeable to disposition. Will close encounter.

## 2017-12-11 NOTE — Telephone Encounter (Signed)
-----   Message from Mychart, Generic sent at 12/11/2017 9:58 AM EST -----    So I had my period on 10/31/17 and my cycles are every 31-32 ranges. I havent had a cycle for this month yet took a pregnancy test this Monday and today in the morning both were negative. My breast have been full and heavy for the past 2-3weeks. I have cramping here and there but no sign of period, I feel warmer than usual, and some headaches here and there. My cycles are regular for the most part and I am getting over a slight cold. I just want to know if Im testing too early or should still wait until the rest of the month to see if my cycle comes? This is unusual to me

## 2017-12-11 NOTE — Telephone Encounter (Signed)
Routing to [name] for review and advise. 

## 2017-12-11 NOTE — Telephone Encounter (Signed)
agree

## 2017-12-11 NOTE — Telephone Encounter (Signed)
If she is having pain she will need OV. If she just feels this is period related she can repeat UPT and advise

## 2018-01-20 ENCOUNTER — Telehealth: Payer: Self-pay | Admitting: Certified Nurse Midwife

## 2018-01-20 NOTE — Telephone Encounter (Signed)
Patent states she has had a change in discharge since ending her cycle and would like to come in and Anita SauersDeborah Leonard on Wednesday. Patient states she may not be able to answer the phone when we call so please leave a detailed message and she will call us back.. Please advise

## 2018-01-20 NOTE — Telephone Encounter (Signed)
Patients cycle ended on 01/15/2018. Began to have burning sensation in vaginal area after menses. This has subsided. Now has thin watery discharge. Advised will need to be seen for further evaluation. Declines appointment for tomorrow. Appointment scheduled for 01/22/2018 at 12:45 pm with Leota Sauerseborah Leonard CNM. Patient is agreeable to date and time.  Routing to provider for final review. Patient agreeable to disposition. Will close encounter.

## 2018-01-22 ENCOUNTER — Other Ambulatory Visit: Payer: Self-pay

## 2018-01-22 ENCOUNTER — Encounter: Payer: Self-pay | Admitting: Certified Nurse Midwife

## 2018-01-22 ENCOUNTER — Ambulatory Visit (INDEPENDENT_AMBULATORY_CARE_PROVIDER_SITE_OTHER): Payer: BLUE CROSS/BLUE SHIELD | Admitting: Certified Nurse Midwife

## 2018-01-22 VITALS — BP 122/80 | HR 80 | Resp 14 | Wt 174.0 lb

## 2018-01-22 DIAGNOSIS — N898 Other specified noninflammatory disorders of vagina: Secondary | ICD-10-CM | POA: Diagnosis not present

## 2018-01-22 NOTE — Progress Notes (Signed)
27 y.o. Single African American female [id number] here with complaint of vaginal symptoms of burning cervix, and increase discharge. Describes discharge as  white, no odor. Periods normal, no issues.Onset of symptoms 4 -5 days ago. Denies new personal products or vaginal dryness. No STD concerns. Urinary symptoms none . Contraception is condoms, but not sexually active, since 11/18.  She bought new Diva cup to use with period and noted the cervical burning after use. No bleeding since period was over. No other health issues.  ROS pertinent to HPI  O:Healthy female WDWN Affect: normal, orientation x 3  Exam:Skin: warm and dry Abdomen:soft, non tender  Inguinal Lymph nodes: no enlargement or tenderness Pelvic exam: External genital: normal female,, no lesions BUS: negative Vagina: white normal appearing discharge noted. Affirm taken Cervix: normal, non tender, no CMT, no excoriations or ulcerations Uterus: normal, non tender Adnexa:normal, non tender, no masses or fullness noted   A:Normal pelvic exam R/O vaginal infection Suspect sensation was new Diva cup with suction change   P:Discussed findings of normal pelvic exam and discharge and cervix appearance. Will await affirm and treat if indicated. Discussed new Diva cup may have stimulated sensation she felt. Will advise if continues. Questions addressed.  Rv prn

## 2018-01-23 LAB — VAGINITIS/VAGINOSIS, DNA PROBE
Candida Species: NEGATIVE
Gardnerella vaginalis: NEGATIVE
Trichomonas vaginosis: NEGATIVE

## 2018-02-20 DIAGNOSIS — L738 Other specified follicular disorders: Secondary | ICD-10-CM | POA: Diagnosis not present

## 2018-02-21 DIAGNOSIS — K219 Gastro-esophageal reflux disease without esophagitis: Secondary | ICD-10-CM | POA: Diagnosis not present

## 2018-02-25 ENCOUNTER — Ambulatory Visit: Payer: Managed Care, Other (non HMO) | Admitting: Certified Nurse Midwife

## 2018-03-14 ENCOUNTER — Encounter: Payer: Self-pay | Admitting: Certified Nurse Midwife

## 2018-03-17 DIAGNOSIS — K219 Gastro-esophageal reflux disease without esophagitis: Secondary | ICD-10-CM | POA: Diagnosis not present

## 2018-03-17 DIAGNOSIS — R079 Chest pain, unspecified: Secondary | ICD-10-CM | POA: Diagnosis not present

## 2018-04-02 ENCOUNTER — Encounter: Payer: Self-pay | Admitting: Certified Nurse Midwife

## 2018-06-23 ENCOUNTER — Emergency Department: Payer: BLUE CROSS/BLUE SHIELD

## 2018-06-23 ENCOUNTER — Emergency Department
Admission: EM | Admit: 2018-06-23 | Discharge: 2018-06-23 | Disposition: A | Discharge status: Home / Self Care | Payer: BLUE CROSS/BLUE SHIELD | Attending: Emergency Medicine | Admitting: Emergency Medicine

## 2018-06-23 ENCOUNTER — Other Ambulatory Visit: Payer: Self-pay

## 2018-06-23 DIAGNOSIS — R42 Dizziness and giddiness: Secondary | ICD-10-CM | POA: Diagnosis not present

## 2018-06-23 DIAGNOSIS — F419 Anxiety disorder, unspecified: Secondary | ICD-10-CM | POA: Diagnosis not present

## 2018-06-23 DIAGNOSIS — R000 Tachycardia, unspecified: Secondary | ICD-10-CM | POA: Diagnosis present

## 2018-06-23 DIAGNOSIS — J45909 Unspecified asthma, uncomplicated: Secondary | ICD-10-CM | POA: Diagnosis not present

## 2018-06-23 DIAGNOSIS — R202 Paresthesia of skin: Secondary | ICD-10-CM | POA: Diagnosis not present

## 2018-06-23 LAB — HCG, QUANTITATIVE, PREGNANCY: hCG, Beta Chain, Quant, S: <1 m[IU]/mL (ref <5)

## 2018-06-23 LAB — BASIC METABOLIC PANEL
Anion gap: 9 (ref 5–15)
BUN: 12 mg/dL (ref 6–20)
CHLORIDE: 107 mmol/L (ref 98–111)
CO2: 24 mmol/L (ref 22–32)
Calcium: 8.6 mg/dL — ABNORMAL LOW (ref 8.9–10.3)
Creatinine, Ser: 0.75 mg/dL (ref 0.44–1.00)
GFR calc Af Amer: >60 mL/min (ref >60)
GLUCOSE: 112 mg/dL — AB (ref 70–99)
Potassium: 3.8 mmol/L (ref 3.5–5.1)
SODIUM: 140 mmol/L (ref 135–145)

## 2018-06-23 LAB — TROPONIN I: Troponin I: <0.03 ng/mL (ref <0.03)

## 2018-06-23 LAB — CBC
HCT: 38.8 % (ref 35.0–47.0)
Hemoglobin: 13.1 g/dL (ref 12.0–16.0)
MCH: 32 pg (ref 26.0–34.0)
MCHC: 33.9 g/dL (ref 32.0–36.0)
MCV: 94.4 fL (ref 80.0–100.0)
PLATELETS: 325 10*3/uL (ref 150–440)
RBC: 4.11 MIL/uL (ref 3.80–5.20)
RDW: 12.9 % (ref 11.5–14.5)
WBC: 6.7 10*3/uL (ref 3.6–11.0)

## 2018-06-23 LAB — TSH: TSH: 1.334 u[IU]/mL (ref 0.350–4.500)

## 2018-06-23 MED ORDER — ONDANSETRON 4 MG PO TBDP
ORAL_TABLET | ORAL | Status: AC
  Filled 2018-06-23: qty 1

## 2018-06-23 MED ORDER — ONDANSETRON 4 MG PO TBDP: 4.0000 mg | ORAL_TABLET | Freq: Once | ORAL | Status: DC

## 2018-06-23 NOTE — ED Provider Notes (Signed)
[facility] Emergency Department Provider Note _______   First MD Initiated Contact with Patient 06/23/18 0408     (approximate)  I have reviewed the triage vital signs and the nursing notes.   HISTORY  Chief Complaint Tachycardia    HPI Anita Bush is a 27 y.o. female with below list of chronic medical conditions presents to the emergency department with awakening this morning with sensation of rapid heartbeat with generalized "tingling sensation bilateral upper and lower extremities and face.  Patient states that she feels as though she is "anxious".  Patient states that she no longer feels as though her heart is racing however tingling has not completely subsided.   Past Medical History:  Diagnosis Date  . Asthma   . GERD (gastroesophageal reflux disease)   . Seasonal allergies     Patient Active Problem List   Diagnosis Date Noted  . GERD (gastroesophageal reflux disease) 04/15/2017  . BV (bacterial vaginosis) 08/15/2016  . 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 Unremarkable.    Marland Kitchen. UPPER GI ENDOSCOPY      Prior to Admission medications   Medication Sig Start Date End Date Taking? Authorizing Provider  Multiple Vitamins-Minerals (MULTIVITAMIN PO) Take by mouth.    [provider]    Allergies No known drug allergies  Family History  Problem Relation Age of Onset  . Hypertension Mother        Living  . Arthritis Mother   . Asthma Mother   . Arthritis Maternal Grandmother   . Heart disease Maternal Grandfather     Social History Social History   Tobacco Use  . Smoking status: Never Smoker  . Smokeless tobacco: Never Used  Substance Use Topics  . Alcohol use: Yes    Comment: occ  . Drug use: No    Review of Systems Constitutional: No fever/chills Eyes: No visual  changes. ENT: No sore throat. Cardiovascular: Denies chest pain.  Positive for sensation of heart racing now resolved. Respiratory: Denies shortness of breath. Gastrointestinal: No abdominal pain.  No nausea, no vomiting.  No diarrhea.  No constipation. Genitourinary: Negative for dysuria. Musculoskeletal: Negative for neck pain.  Negative for back pain. Integumentary: Negative for rash. Neurological: Negative for headaches, focal weakness or numbness.  Positive for generalized tingling   ____________________________________________   PHYSICAL EXAM:  VITAL SIGNS: ED Triage Vitals  Enc Vitals Group     BP 06/23/18 0231 136/87     Pulse Rate 06/23/18 0231 87     Resp 06/23/18 0231 20     Temp 06/23/18 0231 98.3 F (36.8 C)     Temp Source 06/23/18 0231 Oral     SpO2 06/23/18 0231 98 %     Weight 06/23/18 0210 72.6 kg (160 lb)     Height 06/23/18 0210 1.702 m (5\' 7" )     Head Circumference --      Peak Flow --      Pain Score 06/23/18 0209 0     Pain Loc --      Pain Edu? --      Excl. in GC? --     Constitutional: Alert and oriented. Well appearing and in no acute distress. Eyes: Conjunctivae are normal.  Head: Atraumatic. Mouth/Throat: Mucous membranes are moist.  Oropharynx non-erythematous. Neck: No stridor.   Cardiovascular: Normal rate, regular rhythm. Good  peripheral circulation. Grossly normal heart sounds. Respiratory: Normal respiratory effort.  No retractions. Lungs CTAB. Gastrointestinal: Soft and nontender. No distention.  Musculoskeletal: No lower extremity tenderness nor edema. No gross deformities of extremities. Neurologic:  Normal speech and language. No gross focal neurologic deficits are appreciated.  Skin:  Skin is warm, dry and intact. No rash noted. Psychiatric: Mood and affect are normal. Speech and behavior are normal.  ____________________________________________   LABS (all labs ordered are listed, but only abnormal results are  displayed)  Labs Reviewed  BASIC METABOLIC PANEL - Abnormal; Notable for the following components:      Result Value   Glucose, Bld 112 (*)    Calcium 8.6 (*)    All other components within normal limits  CBC  TROPONIN I  HCG, QUANTITATIVE, PREGNANCY  TSH  POC URINE PREG, ED    RADIOLOGY I, [place] N BROWN, personally viewed and evaluated these images (plain radiographs) as part of my medical decision making, as well as reviewing the written report by the radiologist.  ED MD interpretation: No active cardiopulmonary disease on chest x-ray per radiologist  Official radiology report(s): Dg Chest 2 View  Result Date: 06/23/2018 CLINICAL DATA:  Racing heart, dizzy EXAM: CHEST - 2 VIEW COMPARISON:  None. FINDINGS: The heart size and mediastinal contours are within normal limits. Both lungs are clear. The visualized skeletal structures are unremarkable. IMPRESSION: No active cardiopulmonary disease. Electronically Signed   By: Jasmine Pang M.D.   On: 06/23/2018 02:43   ED ECG REPORT I, [place] N BROWN, the attending physician, personally viewed and interpreted this ECG.   Date: 06/23/2018  EKG Time: 2:12 AM  Rate: 106  Rhythm: Sinus tachycardia  Axis: Normal  Intervals: Normal  ST&T Change: None   Procedures   ____________________________________________   INITIAL IMPRESSION / ASSESSMENT AND PLAN / ED COURSE  As part of my medical decision making, I reviewed the following data within the electronic MEDICAL RECORD NUMBER   27 year old female presenting with above-stated history and physical exam of secondary to feeling anxious with generalized tingling and rapid heartbeat.  Patient's EKG revealed a normal sinus rhythm without ectopy.  I suspect the etiology of the patient's symptoms to be anxiety given low normal laboratory data and EKG ____________________________________________  FINAL CLINICAL IMPRESSION(S) / ED DIAGNOSES  Final diagnoses:  Anxiety     MEDICATIONS  GIVEN DURING THIS VISIT:  Medications  ondansetron (ZOFRAN-ODT) disintegrating tablet 4 mg (has no administration in time range)     ED Discharge Orders    None       Note:  This document was prepared using Dragon voice recognition software and may include unintentional dictation errors.    Darci Current, MD 06/23/18 [phone]

## 2018-06-23 NOTE — ED Notes (Signed)
Patient in to triage 1 to receive medication for nausea.  Patient refuses medication at this time.  Instructed patient to let staff know if she changes her mind.  Patient request to have blood pressure rechecked.

## 2018-06-23 NOTE — ED Notes (Signed)
Informed by radiology tech that patient reports feeling nauseated.

## 2018-06-23 NOTE — ED Triage Notes (Signed)
Patient reports woke up felt like heart was racing, dizzy and tingling.

## 2018-06-23 NOTE — ED Notes (Signed)
Patient transported to X-ray 

## 2018-07-12 ENCOUNTER — Encounter (HOSPITAL_COMMUNITY): Payer: Self-pay | Admitting: Emergency Medicine

## 2018-07-12 ENCOUNTER — Emergency Department (HOSPITAL_COMMUNITY): Payer: BLUE CROSS/BLUE SHIELD

## 2018-07-12 ENCOUNTER — Emergency Department (HOSPITAL_COMMUNITY)
Admission: EM | Admit: 2018-07-12 | Discharge: 2018-07-12 | Disposition: A | Discharge status: Home / Self Care | Payer: BLUE CROSS/BLUE SHIELD | Attending: Emergency Medicine | Admitting: Emergency Medicine

## 2018-07-12 ENCOUNTER — Other Ambulatory Visit: Payer: Self-pay

## 2018-07-12 DIAGNOSIS — Z79899 Other long term (current) drug therapy: Secondary | ICD-10-CM | POA: Diagnosis not present

## 2018-07-12 DIAGNOSIS — R0989 Other specified symptoms and signs involving the circulatory and respiratory systems: Secondary | ICD-10-CM | POA: Insufficient documentation

## 2018-07-12 DIAGNOSIS — J45909 Unspecified asthma, uncomplicated: Secondary | ICD-10-CM | POA: Insufficient documentation

## 2018-07-12 DIAGNOSIS — M795 Residual foreign body in soft tissue: Secondary | ICD-10-CM | POA: Diagnosis not present

## 2018-07-12 LAB — GROUP A STREP BY PCR: GROUP A STREP BY PCR: NOT DETECTED

## 2018-07-12 MED ORDER — SUCRALFATE 1 GM/10ML PO SUSP: 1.0000 g | Freq: Three times a day (TID) | ORAL | 0 refills | Status: DC

## 2018-07-12 MED ORDER — GI COCKTAIL ~~LOC~~
30.0000 mL | Freq: Once | ORAL | Status: AC
  Administered 2018-07-12: 30 mL via ORAL
  Filled 2018-07-12: qty 30

## 2018-07-12 NOTE — ED Triage Notes (Signed)
Pt reports having sore throat after swallowing chicken and things a piece of chicken is in her throat. Also reports had sore throat prior to eating chicken

## 2018-07-12 NOTE — ED Provider Notes (Signed)
[facility]-EMERGENCY DEPT Provider Note   CSN: [id number] Arrival date & time: 07/12/18  0130     History   Chief Complaint Chief Complaint  Patient presents with  . Swallowed Foreign Body    HPI Anita Bush is a 27 y.o. female with a history of GERD who presents emergency department today for possible swallowed foreign body.  Patient states for the last 1 week she has been having sore throat with associated dysphasia.  She notes she has been having increasing symptoms of her GERD with burping and belching. She reports that she lost her voice yesterday. This afternoon around noon she ate some chicken with lunch and feels like it is been stuck in there since then.  She notes that it "moves around" and feels like it is stuck.  Patient has not tried anything for symptoms.  She reports that she has been able to eat and drink since the event. She does report increased burping and belching. Denies fever, chills, inability to control secretions, N/V, abdominal pain, cough or trauma.   HPI  Past Medical History:  Diagnosis Date  . Asthma   . GERD (gastroesophageal reflux disease)   . Seasonal allergies     Patient Active Problem List   Diagnosis Date Noted  . GERD (gastroesophageal reflux disease) 04/15/2017  . BV (bacterial vaginosis) 08/15/2016  . 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 Unremarkable.    [facility] UPPER GI ENDOSCOPY       OB History    Gravida  1   Para  0   Term  0   Preterm  0   AB  1   Living  0     SAB  1   TAB  0   Ectopic  0   Multiple  0   Live Births  0            Home Medications    Prior to Admission medications   Medication Sig Start Date End Date Taking? Authorizing Provider  Multiple Vitamins-Minerals (MULTIVITAMIN PO) Take by mouth.    [provider]    Family History Family History  Problem Relation Age of Onset  . Hypertension Mother        Living  . Arthritis Mother   . Asthma Mother   . Arthritis Maternal Grandmother   . Heart disease Maternal Grandfather     Social History Social History   Tobacco Use  . Smoking status: Never Smoker  . Smokeless tobacco: Never Used  Substance Use Topics  . Alcohol use: Yes    Comment: occ  . Drug use: No     Allergies   Patient has no known allergies.   Review of Systems Review of Systems  All other systems reviewed and are negative.    Physical Exam Updated Vital Signs BP 104/60 (BP Location: Right Arm)   Pulse 89   Temp (!) 97.5 F (36.4 C) (Oral)   Resp 18   Ht 5\' 7"  (1.702 m)   Wt 73.9 kg (163 lb)   LMP 06/15/2018   SpO2 100%   BMI 25.53 kg/m   Physical Exam  Constitutional: She appears well-developed and well-nourished.  HENT:  Head: Normocephalic and atraumatic.  Right Ear: External ear normal.  Left Ear: External ear normal.  Nose: Nose normal.  Mouth/Throat: Uvula is  midline, oropharynx is clear and moist and mucous membranes are normal. No tonsillar exudate.  Patient with laryngitis. She is in control of secretions. No stridor.  Midline uvula without edema. Soft palate rises symmetrically.  No tonsillar erythema or exudates. No PTA. Tongue protrusion is normal. No trismus. No creptius on neck palpation and patient has good dentition. No gingival erythema or fluctuance noted. Mucus membranes moist.   Eyes: Pupils are equal, round, and reactive to light. Right eye exhibits no discharge. Left eye exhibits no discharge. No scleral icterus.  Neck: Trachea normal. Neck supple. No spinous process tenderness present. No neck rigidity. Normal range of motion present.  No stridor  Cardiovascular: Normal rate, regular rhythm and intact distal pulses.  No murmur heard. Pulses:      Radial pulses are 2+ on the right side, and 2+ on the left side.        Dorsalis pedis pulses are 2+ on the right side, and 2+ on the left side.       Posterior tibial pulses are 2+ on the right side, and 2+ on the left side.  Pulmonary/Chest: Effort normal and breath sounds normal. She exhibits no tenderness.  No increased work of breathing. No accessory muscle use. Patient is sitting upright, speaking in full sentences without difficulty   Abdominal: Soft. Bowel sounds are normal. She exhibits no distension. There is no tenderness. There is no rigidity, no rebound, no guarding and no CVA tenderness.  Musculoskeletal: She exhibits no edema.  Lymphadenopathy:    She has no cervical adenopathy.  Neurological: She is alert.  Skin: Skin is warm and dry. No rash noted. She is not diaphoretic.  Psychiatric: She has a normal mood and affect.  Nursing note and vitals reviewed.    ED Treatments / Results  Labs (all labs ordered are listed, but only abnormal results are displayed) Labs Reviewed  GROUP A STREP BY PCR    EKG None  Radiology Dg Neck Soft Tissue  Result Date: 07/12/2018 CLINICAL DATA:  Initial evaluation for possible foreign body. EXAM: NECK SOFT TISSUES - 1+ VIEW COMPARISON:  None. FINDINGS: There is no evidence of retropharyngeal soft tissue swelling or epiglottic enlargement. The cervical airway is unremarkable and no radio-opaque foreign body identified. IMPRESSION: Negative.  No radiopaque foreign body identified. Electronically Signed   By: Rise Mu M.D.   On: 07/12/2018 06:41    Procedures Procedures (including critical care time)  Medications Ordered in ED Medications  gi cocktail (Maalox,Lidocaine,Donnatal) (30 mLs Oral Given 07/12/18 1610)     Initial Impression / Assessment and Plan / ED Course  I have reviewed the triage vital signs and the nursing notes.  Pertinent labs & imaging results that were available during my care of the patient were reviewed by me and considered in my medical decision making (see chart for  details).     27 year old female with a history of GERD presenting with concerns for possible swallowed foreign body.  She reports preceding her symptoms she did have a sore throat with associated dysphasia but this did resolve.  She denies increasing symptoms of her GERD including burping and belching.  Yesterday she ate chicken at lunch she felt like it has been stuck since then.  She reports is been able to eat and drink since that time.  She denies any shortness of breath and has clear lung sounds throughout bilaterally.  Patient control of her secretions.  There is no stridor on exam.  Vital signs are  reassuring.  X-ray without evidence of foreign body.  Strep test is negative.  No evidence of PTA or RPA.  Symptoms relieved with GI cocktail.  Will discharge home on Carafate.  She has already scheduled appointment for follow-up with ENT on Monday.  Will have her keep this follow-up.  Return precautions discussed.  Patient appears safe for discharge.  Final Clinical Impressions(s) / ED Diagnoses   Final diagnoses:  Foreign body sensation in throat    ED Discharge Orders        Ordered    sucralfate (CARAFATE) 1 GM/10ML suspension  3 times daily with meals & bedtime     07/12/18 0731       Jacinto HalimMaczis, Samya Siciliano M, PA-C 07/13/18 1449    Palumbo, April, MD 07/16/18 2336

## 2018-07-12 NOTE — Discharge Instructions (Addendum)
You were seen here today for sensation for swallow foreign body.  Your xray and strep test were negative.  Please take carafate as prescribed.  Please follow up with your ear nose and throat doctor on your scheduled appointment on 07/15/18 Return if You cannot swallow your secretions or liquid.  You continue to have symptoms of a swallowed foreign body despite therapy You have a fever. You have pain in your chest or your abdomen. You cough up blood. You have blood in your stool (feces) or your vomit.

## 2018-07-12 NOTE — ED Notes (Signed)
ED Provider at bedside. 

## 2018-07-15 DIAGNOSIS — J358 Other chronic diseases of tonsils and adenoids: Secondary | ICD-10-CM | POA: Diagnosis not present

## 2018-07-15 DIAGNOSIS — J029 Acute pharyngitis, unspecified: Secondary | ICD-10-CM | POA: Diagnosis not present

## 2018-07-15 DIAGNOSIS — R070 Pain in throat: Secondary | ICD-10-CM | POA: Diagnosis not present

## 2018-07-15 DIAGNOSIS — K219 Gastro-esophageal reflux disease without esophagitis: Secondary | ICD-10-CM | POA: Diagnosis not present

## 2018-07-15 DIAGNOSIS — H9012 Conductive hearing loss, unilateral, left ear, with unrestricted hearing on the contralateral side: Secondary | ICD-10-CM | POA: Diagnosis not present

## 2018-07-24 DIAGNOSIS — R1013 Epigastric pain: Secondary | ICD-10-CM | POA: Diagnosis not present

## 2018-07-24 DIAGNOSIS — F458 Other somatoform disorders: Secondary | ICD-10-CM | POA: Diagnosis not present

## 2018-07-24 DIAGNOSIS — K219 Gastro-esophageal reflux disease without esophagitis: Secondary | ICD-10-CM | POA: Diagnosis not present

## 2018-07-24 DIAGNOSIS — R1310 Dysphagia, unspecified: Secondary | ICD-10-CM | POA: Diagnosis not present

## 2018-08-01 DIAGNOSIS — Z113 Encounter for screening for infections with a predominantly sexual mode of transmission: Secondary | ICD-10-CM | POA: Diagnosis not present

## 2018-09-12 DIAGNOSIS — L700 Acne vulgaris: Secondary | ICD-10-CM | POA: Diagnosis not present

## 2018-09-12 DIAGNOSIS — L738 Other specified follicular disorders: Secondary | ICD-10-CM | POA: Diagnosis not present

## 2018-09-18 DIAGNOSIS — G43109 Migraine with aura, not intractable, without status migrainosus: Secondary | ICD-10-CM | POA: Diagnosis not present

## 2018-09-18 DIAGNOSIS — R031 Nonspecific low blood-pressure reading: Secondary | ICD-10-CM | POA: Diagnosis not present

## 2018-09-18 DIAGNOSIS — H538 Other visual disturbances: Secondary | ICD-10-CM | POA: Diagnosis not present

## 2018-09-18 DIAGNOSIS — Z79899 Other long term (current) drug therapy: Secondary | ICD-10-CM | POA: Diagnosis not present

## 2018-09-18 DIAGNOSIS — K219 Gastro-esophageal reflux disease without esophagitis: Secondary | ICD-10-CM | POA: Diagnosis not present

## 2018-10-30 IMAGING — CR DG CHEST 2V
1 series · 3 of 3 positions shown · non-contrast
Comparison: None.

CLINICAL DATA: Racing heart, dizzy

EXAM:
CHEST - 2 VIEW

[Series 1: dg chest 2 view · 0.14mm/px · 3 of 3 slices shown]
[im 1/3]
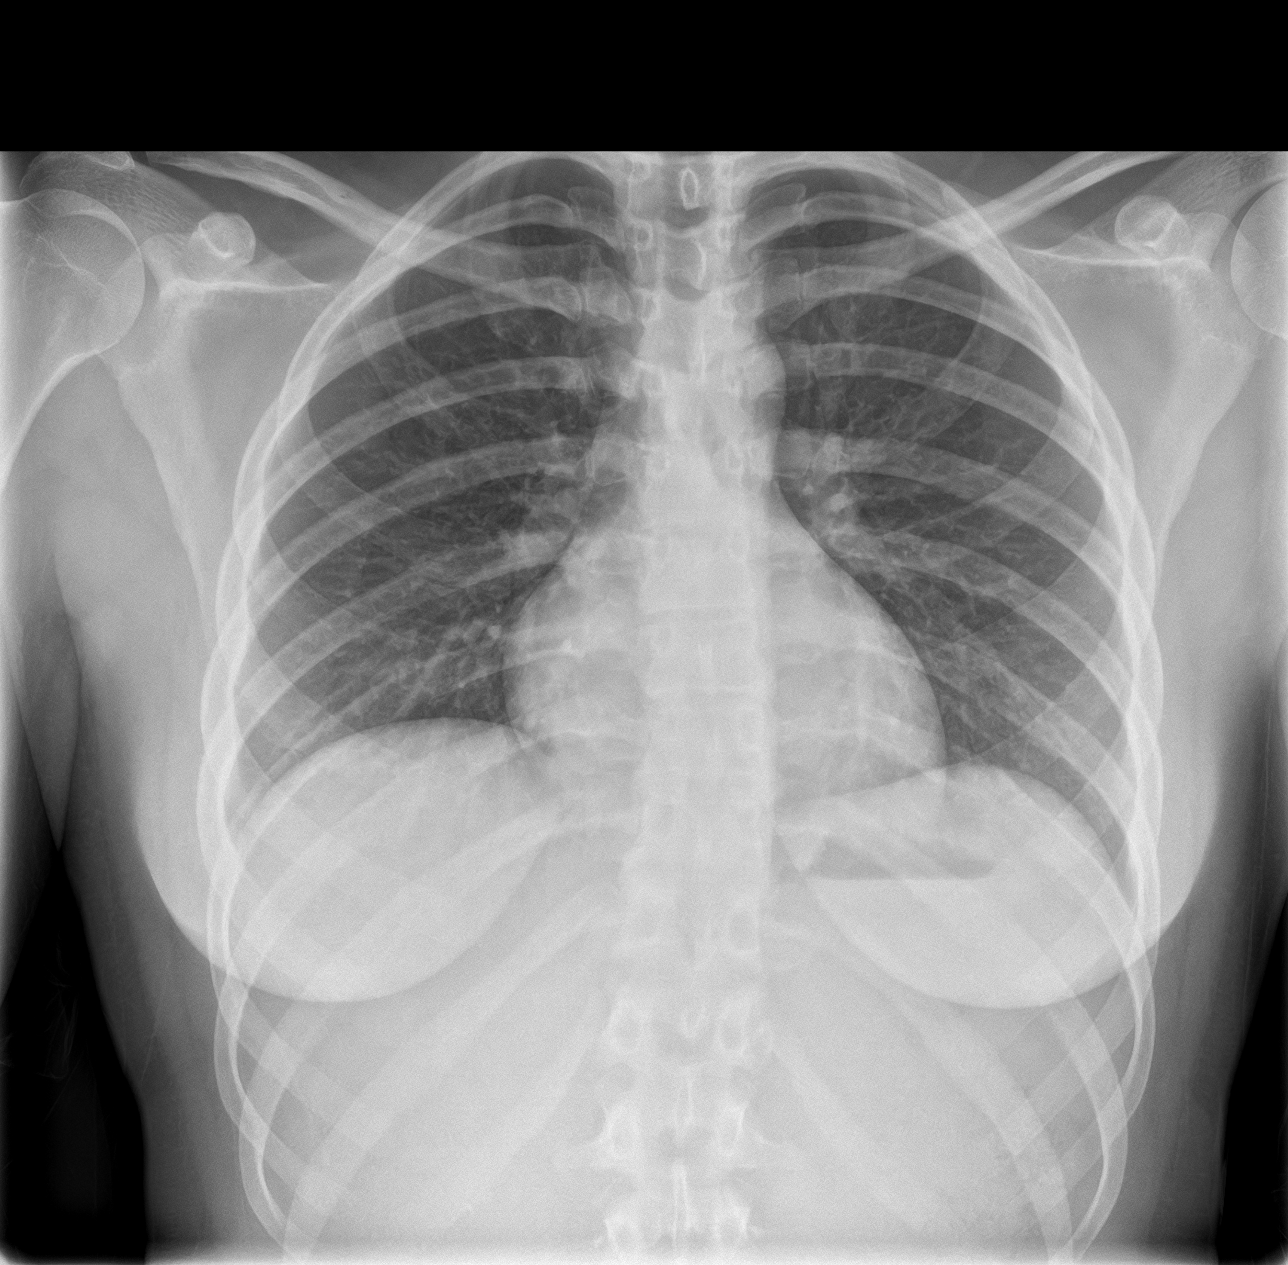
[im 2/3]
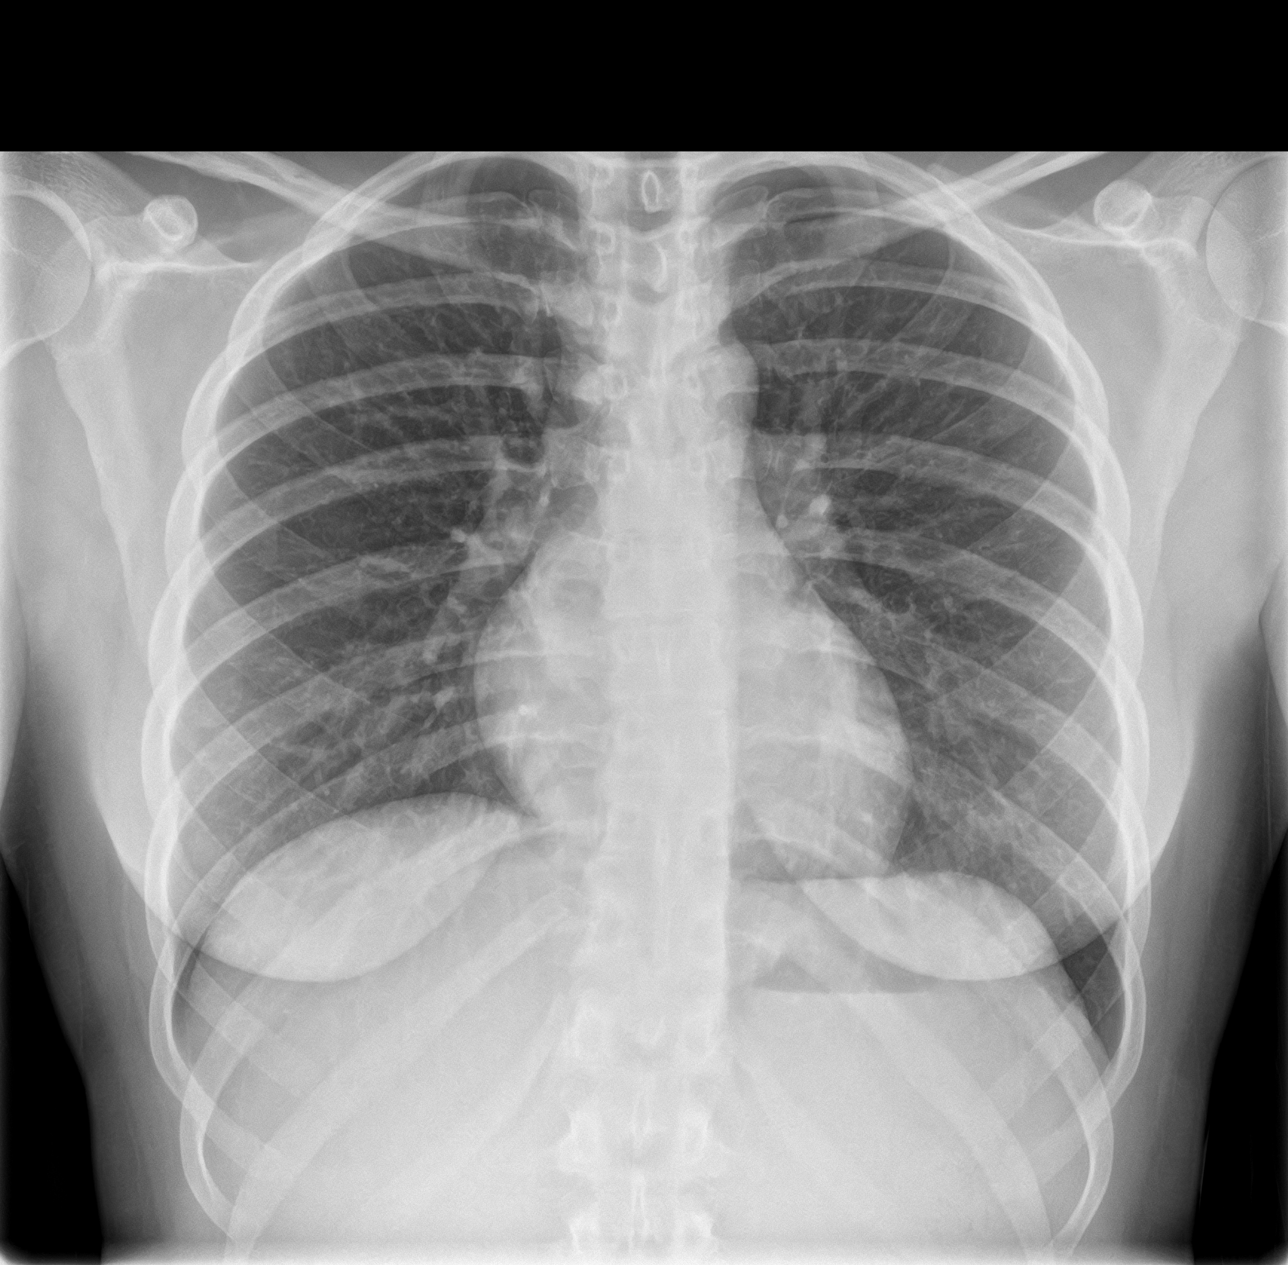
[im 3/3]
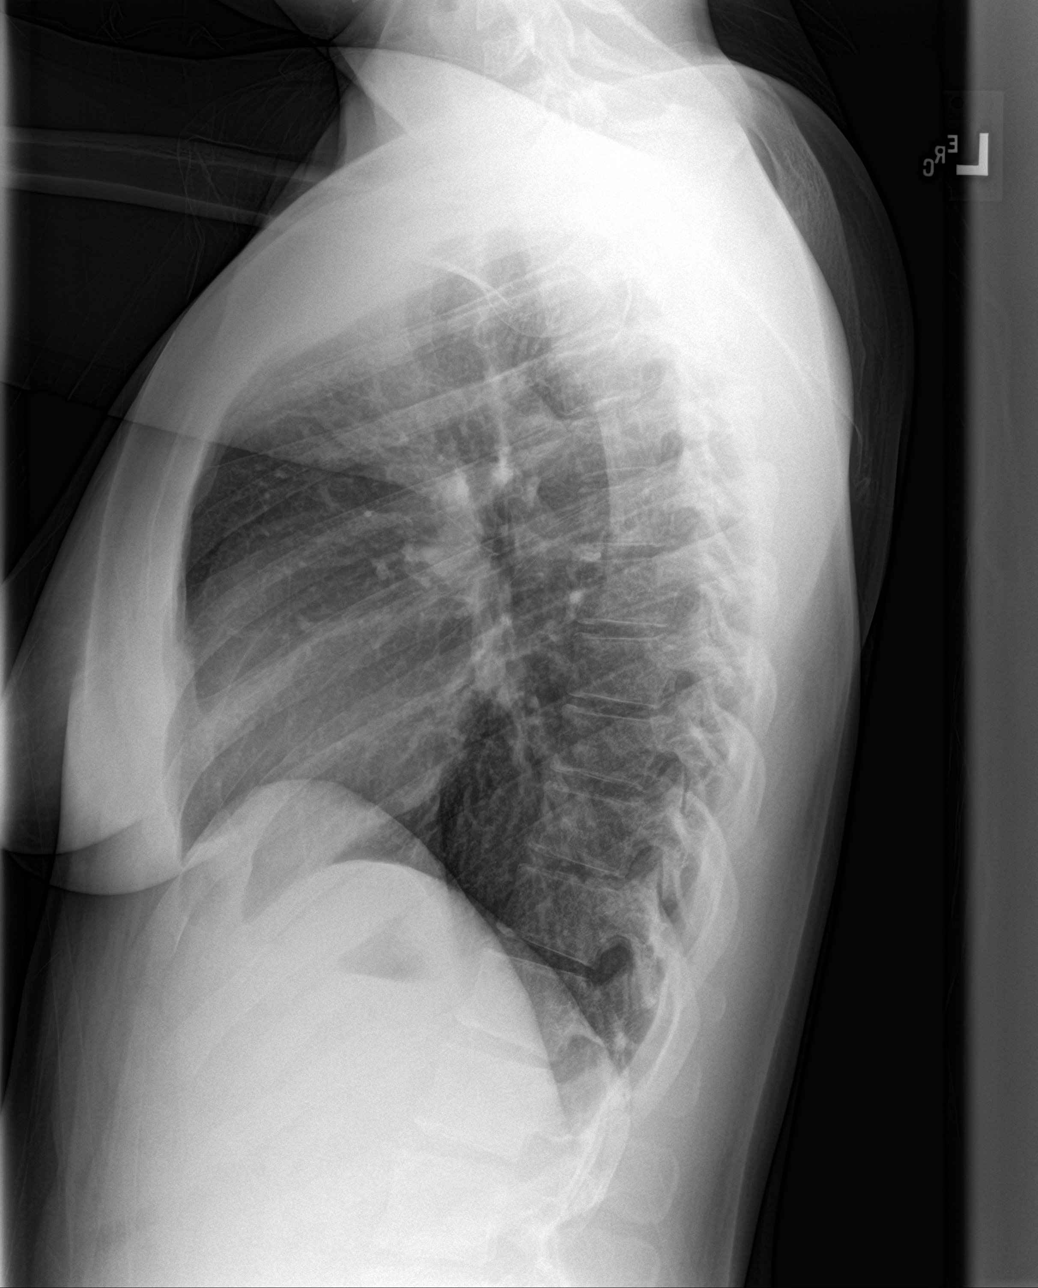

[3 of 3 positions shown; findings below may reference images not displayed]

FINDINGS: The heart size and mediastinal contours are within normal limits.
Both lungs are clear. The visualized skeletal structures are
unremarkable.
IMPRESSION: No active cardiopulmonary disease.

## 2019-01-06 DIAGNOSIS — H538 Other visual disturbances: Secondary | ICD-10-CM | POA: Diagnosis not present

## 2019-01-06 DIAGNOSIS — H43392 Other vitreous opacities, left eye: Secondary | ICD-10-CM | POA: Diagnosis not present

## 2019-02-06 DIAGNOSIS — H43393 Other vitreous opacities, bilateral: Secondary | ICD-10-CM | POA: Diagnosis not present

## 2019-02-06 DIAGNOSIS — Z79899 Other long term (current) drug therapy: Secondary | ICD-10-CM | POA: Diagnosis not present

## 2019-02-06 DIAGNOSIS — H5319 Other subjective visual disturbances: Secondary | ICD-10-CM | POA: Diagnosis not present

## 2019-02-06 DIAGNOSIS — N939 Abnormal uterine and vaginal bleeding, unspecified: Secondary | ICD-10-CM | POA: Diagnosis not present

## 2019-02-06 DIAGNOSIS — Z3009 Encounter for other general counseling and advice on contraception: Secondary | ICD-10-CM | POA: Diagnosis not present

## 2019-05-25 DIAGNOSIS — L738 Other specified follicular disorders: Secondary | ICD-10-CM | POA: Diagnosis not present

## 2019-05-25 DIAGNOSIS — L700 Acne vulgaris: Secondary | ICD-10-CM | POA: Diagnosis not present

## 2019-07-02 DIAGNOSIS — R0989 Other specified symptoms and signs involving the circulatory and respiratory systems: Secondary | ICD-10-CM | POA: Diagnosis not present

## 2019-07-02 DIAGNOSIS — U071 COVID-19: Secondary | ICD-10-CM | POA: Diagnosis not present

## 2019-07-16 DIAGNOSIS — R430 Anosmia: Secondary | ICD-10-CM | POA: Diagnosis not present

## 2019-08-03 DIAGNOSIS — R430 Anosmia: Secondary | ICD-10-CM | POA: Diagnosis not present

## 2019-08-19 DIAGNOSIS — L819 Disorder of pigmentation, unspecified: Secondary | ICD-10-CM | POA: Diagnosis not present

## 2019-09-14 DIAGNOSIS — R430 Anosmia: Secondary | ICD-10-CM | POA: Diagnosis not present

## 2019-09-16 DIAGNOSIS — F411 Generalized anxiety disorder: Secondary | ICD-10-CM | POA: Diagnosis not present

## 2019-09-16 DIAGNOSIS — R062 Wheezing: Secondary | ICD-10-CM | POA: Diagnosis not present

## 2019-09-16 DIAGNOSIS — F321 Major depressive disorder, single episode, moderate: Secondary | ICD-10-CM | POA: Diagnosis not present

## 2019-09-16 DIAGNOSIS — Z0000 Encounter for general adult medical examination without abnormal findings: Secondary | ICD-10-CM | POA: Diagnosis not present

## 2019-11-04 DIAGNOSIS — Z124 Encounter for screening for malignant neoplasm of cervix: Secondary | ICD-10-CM | POA: Diagnosis not present

## 2019-11-04 DIAGNOSIS — Z01419 Encounter for gynecological examination (general) (routine) without abnormal findings: Secondary | ICD-10-CM | POA: Diagnosis not present

## 2019-11-04 DIAGNOSIS — Z1151 Encounter for screening for human papillomavirus (HPV): Secondary | ICD-10-CM | POA: Diagnosis not present

## 2019-11-06 DIAGNOSIS — R0789 Other chest pain: Secondary | ICD-10-CM | POA: Diagnosis not present

## 2019-11-06 DIAGNOSIS — F411 Generalized anxiety disorder: Secondary | ICD-10-CM | POA: Diagnosis not present

## 2020-03-07 ENCOUNTER — Encounter: Payer: Self-pay | Admitting: Certified Nurse Midwife

## 2020-03-09 ENCOUNTER — Encounter: Payer: Self-pay | Admitting: Certified Nurse Midwife

## 2023-05-15 ENCOUNTER — Ambulatory Visit: Payer: BLUE CROSS/BLUE SHIELD | Admitting: Dermatology

## 2023-08-15 ENCOUNTER — Ambulatory Visit: Payer: BLUE CROSS/BLUE SHIELD | Admitting: Dermatology

## 2023-10-14 ENCOUNTER — Ambulatory Visit: Payer: BLUE CROSS/BLUE SHIELD | Admitting: Dermatology

## 2024-06-02 ENCOUNTER — Ambulatory Visit: Payer: BLUE CROSS/BLUE SHIELD | Admitting: Dermatology

## 2024-06-02 ENCOUNTER — Encounter: Payer: Self-pay | Admitting: Dermatology

## 2024-06-02 VITALS — BP 107/74

## 2024-06-02 DIAGNOSIS — D239 Other benign neoplasm of skin, unspecified: Secondary | ICD-10-CM | POA: Diagnosis not present

## 2024-06-02 DIAGNOSIS — L700 Acne vulgaris: Secondary | ICD-10-CM

## 2024-06-02 DIAGNOSIS — L810 Postinflammatory hyperpigmentation: Secondary | ICD-10-CM | POA: Diagnosis not present

## 2024-06-02 MED ORDER — CLINDAMYCIN PHOS (TWICE-DAILY) 1 % EX GEL: Freq: Two times a day (BID) | CUTANEOUS | 6 refills | Status: AC

## 2024-06-02 MED ORDER — TRETINOIN 0.1 % EX CREA: TOPICAL_CREAM | CUTANEOUS | 3 refills | Status: AC

## 2024-06-02 MED ORDER — SAFETY SEAL MISCELLANEOUS MISC: 1.0000 "application " | Freq: Every evening | 4 refills | Status: AC

## 2024-06-02 NOTE — Progress Notes (Signed)
   New Patient Visit   Subjective  Anita Bush is a 33 y.o. female who presents for the following: Hyperpigmentation of face that has come up within the last year. She is using tretinoin 0.1% cream and clindamycin gel every other day for acne. She has used those consistently for the last year. She does wear Cetaphil SPF 30 sunscreen daily.     The following portions of the chart were reviewed this encounter and updated as appropriate: medications, allergies, medical history  Review of Systems:  No other skin or systemic complaints except as noted in HPI or Assessment and Plan.  Objective  Well appearing patient in no apparent distress; mood and affect are within normal limits.   A focused examination was performed of the following areas:   Relevant exam findings are noted in the Assessment and Plan.            Assessment & Plan   1. Acne vulgaris - Assessment: Patient's acne is well-controlled with current treatment regimen of tretinoin 0.1% cream every other night and clindamycin gel every morning. Skin appearance has improved significantly. Patient reports tretinoin daily use was too irritating, leading to current alternate-day application. - Plan:    Continue tretinoin 0.1% cream every other night    Continue clindamycin gel every morning    Prescribe refills for tretinoin 0.1% cream and clindamycin gel  Follow-up in December for potential tretinoin regimen adjustment.  2. Post-inflammatory hyperpigmentation - Assessment:  Patient presents with residual dark spots from previous acne lesions. This hyperpigmentation is mildly bothersome to the patient. Current sun protection practices are suboptimal, which may contribute to persistent or worsening pigmentation.  - Plan:    Prescribe lightening cream containing tranexamic acid, kojic acid, vitamin C, resveratrol, and niacinamide     - Apply pea-sized amount to entire face every other night, alternating with  tretinoin     - Inform patient of approximate $45 cost and specific pharmacy requirement    Recommend daily sunscreen application (SPF 30 or higher) to face, neck, and chest    Recommend [place] brand sunscreen    Advise reapplication of sunscreen every 3 hours when at the beach    Recommend ColorScience brush-on sunscreen and CeraVe Invisible Zinc Sunscreen Stick for midday reapplication    Educate on importance of sun protection in preventing darkening of acne spots  3. Dermatofibroma - Assessment:  Patient reports noticing a spot for some time. Upon examination, it is identified as a dermatofibroma, which is a benign growth composed of scar tissue.  - Plan:    Reassure patient about benign nature of the lesion    No intervention required   Return in about 6 months (around 12/02/2024) for Follow up.  I, Eliot Guernsey, CMA, am acting as scribe for [employer], DO .   Documentation: I have reviewed the above documentation for accuracy and completeness, and I agree with the above.  Louana Roup, DO

## 2024-06-02 NOTE — Patient Instructions (Addendum)
 Date: Tue Jun 02 2024  Hello Anita Bush,  Thank you for visiting today. Here is a summary of the key instructions:  - Medications:   - Continue using tretinoin 0.1% cream every other night   - Apply clindamycin gel every morning   - Use new lightening cream every other night, alternating with tretinoin     - Apply a pea-sized amount for your whole face     - Costs about $45 at a specific pharmacy  - Skincare:   - Apply sunscreen SPF 30 or higher every morning after clindamycin   - Reapply sunscreen midday if out in the sun   - Use sunscreen on face, neck, and chest   - Reapply sunscreen every 3 hours at the beach  - Recommended Products:   - Try Hamp Levine sunscreen (Korean brand)   - Consider ColorScience brush-on sunscreen for midday reapplication   - Try CeraVe Invisible Zinc Sunscreen Stick for midday reapplication  - Follow-up:   - Next appointment in December to adjust tretinoin  We look forward to seeing you at your next visit. If you have any questions or concerns before then, please do not hesitate to contact our office.  Warm regards,  Dr. Louana Roup D[facility]at[facility]   Recommended Sunscreen         Important Information  Due to recent changes in healthcare laws, you may see results of your pathology and/or laboratory studies on MyChart before the doctors have had a chance to review them. We understand that in some cases there may be results that are confusing or concerning to you. Please understand that not all results are received at the same time and often the doctors may need to interpret multiple results in order to provide you with the best plan of care or course of treatment. Therefore, we ask that you please give us  2 business days to thoroughly review all your results before contacting the office for clarification. Should we see a critical lab result, you will be contacted sooner.   If You Need Anything After Your Visit  If you have any questions or concerns  for your doctor, please call our main line at [phone] If no one answers, please leave a voicemail as directed and we will return your call as soon as possible. Messages left after 4 pm will be answered the following business day.   You may also send us  a message via MyChart. We typically respond to MyChart messages within 1-2 business days.  For prescription refills, please ask your pharmacy to contact our office. Our fax number is [phone].  If you have an urgent issue when the clinic is closed that cannot wait until the next business day, you can page your doctor at the number below.    Please note that while we do our best to be available for urgent issues outside of office hours, we are not available 24/7.   If you have an urgent issue and are unable to reach us , you may choose to seek medical care at your doctor's office, retail clinic, urgent care center, or emergency room.  If you have a medical emergency, please immediately call 911 or go to the emergency department. In the event of inclement weather, please call our main line at [phone] for an update on the status of any delays or closures.  Dermatology Medication Tips: Please keep the boxes that topical medications come in in order to help keep track of the instructions about where and how to  use these. Pharmacies typically print the medication instructions only on the boxes and not directly on the medication tubes.   If your medication is too expensive, please contact our office at [phone] or send us  a message through MyChart.   We are unable to tell what your co-pay for medications will be in advance as this is different depending on your insurance coverage. However, we may be able to find a substitute medication at lower cost or fill out paperwork to get insurance to cover a needed medication.   If a prior authorization is required to get your medication covered by your insurance company, please allow us  1-2  business days to complete this process.  Drug prices often vary depending on where the prescription is filled and some pharmacies may offer cheaper prices.  The website www.goodrx.com contains coupons for medications through different pharmacies. The prices here do not account for what the cost may be with help from insurance (it may be cheaper with your insurance), but the website can give you the price if you did not use any insurance.  - You can print the associated coupon and take it with your prescription to the pharmacy.  - You may also stop by our office during regular business hours and pick up a GoodRx coupon card.  - If you need your prescription sent electronically to a different pharmacy, notify our office through [facility] or by phone at [phone]

## 2024-10-29 ENCOUNTER — Encounter: Payer: Self-pay | Admitting: Dermatology

## 2024-10-29 ENCOUNTER — Ambulatory Visit: Admitting: Dermatology

## 2024-10-29 VITALS — BP 122/71 | HR 72

## 2024-10-29 DIAGNOSIS — L700 Acne vulgaris: Secondary | ICD-10-CM

## 2024-10-29 MED ORDER — SPIRONOLACTONE 100 MG PO TABS: 100.0000 mg | ORAL_TABLET | Freq: Every day | ORAL | 6 refills | Status: AC

## 2024-10-29 NOTE — Patient Instructions (Addendum)
 VISIT SUMMARY:  Today, we discussed your persistent acne breakouts, which have become more aggressive despite your current topical treatments. We reviewed your current regimen and made some changes to better address your symptoms.  YOUR PLAN:  -ACNE VULGARIS:  Acne vulgaris is a common skin condition that occurs when hair follicles become clogged with oil and dead skin cells, often leading to pimples, blackheads, or whiteheads. Your current topical treatments are not effectively managing your symptoms, so we are adding an oral medication called spironolactone, which can help with hormonally driven acne.  You should take 100 mg of spironolactone daily at night to minimize potential side effects like lightheadedness and dizziness. Be aware that spironolactone can cause changes in your menstrual cycle and should not be used if you are pregnant or planning to conceive.   Additionally, we provided a sample of Effaclar Duo M with 0.5% salicylic acid for morning use to help with exfoliation. We recommend discontinuing your current facial wash and using Effaclar Duo M instead. If it is too drying, you can go back to your current wash.   For moisturizers, use Avene Tolerance in the morning and Ciclophate at night. Continue using tretinoin  every other night, adjusting the frequency based on skin dryness, especially during winter months.  INSTRUCTIONS:  Morning: -Wash -Clindamycin  -Effaclur Duo -Avene Tolerance  Evening: -Wash -Tretinoin  (every other night) -Avene Cicalfate  Please schedule a follow-up appointment in May to reassess the efficacy of your treatment and make any necessary adjustments.    Important Information  Due to recent changes in healthcare laws, you may see results of your pathology and/or laboratory studies on MyChart before the doctors have had a chance to review them. We understand that in some cases there may be results that are confusing or concerning to you. Please  understand that not all results are received at the same time and often the doctors may need to interpret multiple results in order to provide you with the best plan of care or course of treatment. Therefore, we ask that you please give us  2 business days to thoroughly review all your results before contacting the office for clarification. Should we see a critical lab result, you will be contacted sooner.   If You Need Anything After Your Visit  If you have any questions or concerns for your doctor, please call our main line at [phone] If no one answers, please leave a voicemail as directed and we will return your call as soon as possible. Messages left after 4 pm will be answered the following business day.   You may also send us  a message via MyChart. We typically respond to MyChart messages within 1-2 business days.  For prescription refills, please ask your pharmacy to contact our office. Our fax number is [phone].  If you have an urgent issue when the clinic is closed that cannot wait until the next business day, you can page your doctor at the number below.    Please note that while we do our best to be available for urgent issues outside of office hours, we are not available 24/7.   If you have an urgent issue and are unable to reach us , you may choose to seek medical care at your doctor's office, retail clinic, urgent care center, or emergency room.  If you have a medical emergency, please immediately call 911 or go to the emergency department. In the event of inclement weather, please call our main line at [phone] for an update on  the status of any delays or closures.  Dermatology Medication Tips: Please keep the boxes that topical medications come in in order to help keep track of the instructions about where and how to use these. Pharmacies typically print the medication instructions only on the boxes and not directly on the medication tubes.   If your medication is  too expensive, please contact our office at [phone] or send us  a message through MyChart.   We are unable to tell what your co-pay for medications will be in advance as this is different depending on your insurance coverage. However, we may be able to find a substitute medication at lower cost or fill out paperwork to get insurance to cover a needed medication.   If a prior authorization is required to get your medication covered by your insurance company, please allow us  1-2 business days to complete this process.  Drug prices often vary depending on where the prescription is filled and some pharmacies may offer cheaper prices.  The website www.goodrx.com contains coupons for medications through different pharmacies. The prices here do not account for what the cost may be with help from insurance (it may be cheaper with your insurance), but the website can give you the price if you did not use any insurance.  - You can print the associated coupon and take it with your prescription to the pharmacy.  - You may also stop by our office during regular business hours and pick up a GoodRx coupon card.  - If you need your prescription sent electronically to a different pharmacy, notify our office through [facility] or by phone at [phone]

## 2024-10-29 NOTE — Progress Notes (Signed)
   Follow-Up Visit  Patient (and/or pt guardian) consented to the use of AI-assisted tools for note generation.    Subjective  Anita Bush is a 33 y.o. female who presents for the following: Acne and Post inflammatory hyperpigmentation  Acne Patient was last evaluated on 06/02/24.  At this visit patient was advised to continue using Tretinoin  0.1% clindamycin  gel Patient reports sxs are a little better but still having flare ups that do not go away Patient denies medication changes.  The following portions of the chart were reviewed this encounter and updated as appropriate: medications, allergies, medical history  Review of Systems:  No other skin or systemic complaints except as noted in HPI or Assessment and Plan.  Post Inflammatory Hyperpigmentation  melaxmic cream tranexamic acid, kojic acid, vitamin C, resveratrol, and niacinamide  Patient reports symptoms have improved and areas appear lighter Objective  Well appearing patient in no apparent distress; mood and affect are within normal limits.  A focused examination was performed of the following areas: Face  Relevant exam findings are noted in the Assessment and Plan.            Assessment & Plan   Acne vulgaris Persistent acne vulgaris despite current treatment with triamcinolone , clindamycin , and tretinoin  0.1%. Breakouts are more aggressive and likely hormonally driven. Current topical treatments are insufficient for deeper hormonal acne.  - Prescribed spironolactone 100 mg daily, to be taken at night to minimize potential side effects such as lightheadedness and dizziness. Discussed potential side effects including changes in menstrual cycle, with most experiencing no significant changes. Emphasized the importance of not using spironolactone during pregnancy or while trying to conceive due to its effects on hormone receptors. - Provided sample of Effaclar Duo M with 0.5% salicylic acid for morning use to aid  exfoliation without over-drying. - Recommended discontinuing current facial wash and using Effaclar Duo M instead. If Effaclar Duo M is too drying, revert to current wash. - Provided recommendations for morning and nighttime moisturizers: Tolerance for morning and Ciclophate for nighttime. - Continue tretinoin  every other night, adjusting frequency based on skin dryness, especially during winter months. - Scheduled follow-up appointment in May to reassess treatment efficacy and adjust as needed.  ACNE VULGARIS    Return in about 6 months (around 04/28/2025) for Acne F/U.  I, Lyle Cords, as acting as a neurosurgeon for [employer], DO .   Documentation: I have reviewed the above documentation for accuracy and completeness, and I agree with the above.  Delon Lenis, DO

## 2024-12-03 ENCOUNTER — Ambulatory Visit: Admitting: Dermatology

## 2025-05-04 ENCOUNTER — Ambulatory Visit: Admitting: Dermatology
